# Patient Record
Sex: Female | Born: 1990 | Race: White | Hispanic: No | Marital: Single | Smoking: Never smoker
Health system: Southern US, Community
[De-identification: ages and names within clinical notes are randomized; demographics above are authoritative.]

## PROBLEM LIST (undated history)

## (undated) DIAGNOSIS — F419 Anxiety disorder, unspecified: Secondary | ICD-10-CM

## (undated) DIAGNOSIS — Z8619 Personal history of other infectious and parasitic diseases: Secondary | ICD-10-CM

## (undated) HISTORY — DX: Personal history of other infectious and parasitic diseases: Z86.19

## (undated) HISTORY — DX: Anxiety disorder, unspecified: F41.9

---

## 2014-11-15 LAB — HM HIV SCREENING LAB: HM HIV Screening: NEGATIVE

## 2014-11-15 LAB — HM PAP SMEAR

## 2015-11-19 HISTORY — PX: APPENDECTOMY: SHX54

## 2015-12-09 LAB — CBC AND DIFFERENTIAL
HEMATOCRIT: 39 (ref 36–46)
HEMOGLOBIN: 13.2 (ref 12.0–16.0)
Neutrophils Absolute: 5
Platelets: 302 (ref 150–399)
WBC: 7.8

## 2015-12-09 LAB — HEPATIC FUNCTION PANEL
ALK PHOS: 89 (ref 25–125)
ALT: 33 (ref 7–35)
AST: 28 (ref 13–35)
Bilirubin, Total: 0.7

## 2015-12-09 LAB — BASIC METABOLIC PANEL
BUN: 14 (ref 4–21)
Creatinine: 1 (ref 0.5–1.1)
Glucose: 89
POTASSIUM: 4.2 (ref 3.4–5.3)
SODIUM: 140 (ref 137–147)

## 2015-12-15 LAB — HM PAP SMEAR: HM PAP: REACTIVE

## 2017-01-18 DIAGNOSIS — Z8619 Personal history of other infectious and parasitic diseases: Secondary | ICD-10-CM

## 2017-01-18 HISTORY — DX: Personal history of other infectious and parasitic diseases: Z86.19

## 2017-03-28 LAB — HM PAP SMEAR

## 2017-07-31 ENCOUNTER — Encounter (HOSPITAL_COMMUNITY): Payer: Self-pay

## 2017-07-31 ENCOUNTER — Other Ambulatory Visit: Payer: Self-pay

## 2017-07-31 ENCOUNTER — Emergency Department (HOSPITAL_COMMUNITY): Payer: PRIVATE HEALTH INSURANCE

## 2017-07-31 ENCOUNTER — Emergency Department (HOSPITAL_COMMUNITY)
Admission: EM | Admit: 2017-07-31 | Discharge: 2017-07-31 | Disposition: A | Discharge status: Home / Self Care | Payer: PRIVATE HEALTH INSURANCE | Attending: Emergency Medicine | Admitting: Emergency Medicine

## 2017-07-31 DIAGNOSIS — Y9302 Activity, running: Secondary | ICD-10-CM | POA: Insufficient documentation

## 2017-07-31 DIAGNOSIS — S99911A Unspecified injury of right ankle, initial encounter: Secondary | ICD-10-CM | POA: Diagnosis present

## 2017-07-31 DIAGNOSIS — Y92830 Public park as the place of occurrence of the external cause: Secondary | ICD-10-CM | POA: Insufficient documentation

## 2017-07-31 DIAGNOSIS — S93401A Sprain of unspecified ligament of right ankle, initial encounter: Secondary | ICD-10-CM | POA: Diagnosis not present

## 2017-07-31 DIAGNOSIS — S9304XA Dislocation of right ankle joint, initial encounter: Secondary | ICD-10-CM | POA: Diagnosis not present

## 2017-07-31 DIAGNOSIS — Y999 Unspecified external cause status: Secondary | ICD-10-CM | POA: Diagnosis not present

## 2017-07-31 DIAGNOSIS — W010XXA Fall on same level from slipping, tripping and stumbling without subsequent striking against object, initial encounter: Secondary | ICD-10-CM | POA: Diagnosis not present

## 2017-07-31 DIAGNOSIS — R112 Nausea with vomiting, unspecified: Secondary | ICD-10-CM

## 2017-07-31 MED ORDER — ONDANSETRON 4 MG PO TBDP: 4.0000 mg | ORAL_TABLET | Freq: Three times a day (TID) | ORAL | 0 refills | Status: DC | PRN

## 2017-07-31 MED ORDER — ONDANSETRON 8 MG PO TBDP
8.0000 mg | ORAL_TABLET | Freq: Once | ORAL | Status: AC
  Administered 2017-07-31: 8 mg via ORAL
  Filled 2017-07-31: qty 1

## 2017-07-31 MED ORDER — NAPROXEN 500 MG PO TABS: 500.0000 mg | ORAL_TABLET | Freq: Two times a day (BID) | ORAL | 0 refills | Status: DC | PRN

## 2017-07-31 MED ORDER — HYDROCODONE-ACETAMINOPHEN 5-325 MG PO TABS: 1.0000 | ORAL_TABLET | Freq: Four times a day (QID) | ORAL | 0 refills | Status: DC | PRN

## 2017-07-31 MED ORDER — HYDROCODONE-ACETAMINOPHEN 5-325 MG PO TABS
1.0000 | ORAL_TABLET | Freq: Once | ORAL | Status: AC
  Administered 2017-07-31: 1 via ORAL
  Filled 2017-07-31: qty 1

## 2017-07-31 MED ORDER — MORPHINE SULFATE (PF) 4 MG/ML IV SOLN
4.0000 mg | Freq: Once | INTRAVENOUS | Status: DC
  Filled 2017-07-31: qty 1

## 2017-07-31 MED ORDER — MORPHINE SULFATE (PF) 4 MG/ML IV SOLN
4.0000 mg | Freq: Once | INTRAVENOUS | Status: AC
  Administered 2017-07-31: 4 mg via INTRAMUSCULAR

## 2017-07-31 MED ORDER — ONDANSETRON HCL 4 MG/2ML IJ SOLN
4.0000 mg | Freq: Once | INTRAMUSCULAR | Status: DC
  Filled 2017-07-31: qty 2

## 2017-07-31 NOTE — ED Provider Notes (Signed)
[facility]-EMERGENCY DEPT Provider Note   CSN: [id number] Arrival date & time: 07/31/17  1446     History   Chief Complaint Chief Complaint  Patient presents with  . Ankle Injury    HPI Helen [name] is a 26 y.o. female with a PMHx of anxiety, who presents to the ED via EMS with complaints of right ankle injury.  Patient states that approximately 2 hours ago she was running on [address] when she accidentally slipped on a wet root, inverting her ankle, and heard a loud pop and crack which instantly made her go to the ground.  When she looked at her ankle, she noticed that it was "floppy", and it's believed it was potentially dislocated.  She attempted to reposition it, which made her vomit due to the intense pain, but she thinks that she was able to get it aligned again.  She ambulated to safety, walking about 20 minutes to find help, and when she found help she was unable to ambulate any further because the pain was extremely intense.  EMS was called, and [place] team who brought her in here.  She endorses 9/10 constant sharp and throbbing right ankle pain that radiates up to the mid calf, worse with movement and walking, and moderately improved with fentanyl 100 mcg intranasal as well as the splint that they put her in.  She reports associated tingling in her toes, and swelling to the ankle.  She also reports mild nausea, and the one episode of nonbloody nonbilious emesis when she repositioned her ankle initially.  She has not eaten anything since 10:15 AM when she had a piece of toast and a banana.  She just recently moved here from [place] several months ago, she does not have a PCP here, and does not have any orthopedic care here.  She denies any wounds, head injury or LOC, neck or back pain, other injury sustained during the incident, bruising, CP, S OB, abdominal pain, hematemesis, numbness, focal  weakness, or any other complaints at this time.   The history is provided by the patient and medical records. No language interpreter was used.  Ankle Injury  This is a new problem. The current episode started 1 to 2 hours ago. The problem occurs constantly. The problem has not changed since onset.Pertinent negatives include no chest pain, no abdominal pain and no shortness of breath. The symptoms are aggravated by walking and standing. Relieved by: fentanyl 100mcg. Treatments tried: fentanyl 100mcg. The treatment provided moderate relief.    History reviewed. No pertinent past medical history.  There are no active problems to display for this patient.   Past Surgical History:  Procedure Laterality Date  . APPENDECTOMY  11/2015    OB History    No data available       Home Medications    Prior to Admission medications   Not on File    Family History No family history on file.  Social History Social History   Tobacco Use  . Smoking status: Never Smoker  . Smokeless tobacco: Never Used  Substance Use Topics  . Alcohol use: No    Frequency: Never  . Drug use: No     Allergies   Patient has no known allergies.   Review of Systems Review of Systems  HENT: Negative for facial swelling (no head inj).   Respiratory: Negative for shortness of  breath.   Cardiovascular: Negative for chest pain.  Gastrointestinal: Positive for nausea and vomiting. Negative for abdominal pain.  Musculoskeletal: Positive for arthralgias and joint swelling. Negative for back pain and neck pain.  Skin: Negative for color change and wound.  Allergic/Immunologic: Negative for immunocompromised state.  Neurological: Negative for syncope, weakness and numbness.  Psychiatric/Behavioral: Negative for confusion.  All other systems reviewed and are negative for acute change except as noted in the HPI.     Physical Exam Updated Vital Signs BP 109/66   Pulse 62   Temp 98 F (36.7 C) (Oral)    Resp 19   SpO2 100%   Physical Exam  Constitutional: She is oriented to person, place, and time. Vital signs are normal. She appears well-developed and well-nourished.  Non-toxic appearance. No distress.  Afebrile, nontoxic, tearful at times, but overall in NAD  HENT:  Head: Normocephalic and atraumatic.  Mouth/Throat: Oropharynx is clear and moist and mucous membranes are normal.  Eyes: Conjunctivae and EOM are normal. Right eye exhibits no discharge. Left eye exhibits no discharge.  Neck: Normal range of motion. Neck supple.  Cardiovascular: Normal rate, regular rhythm, normal heart sounds and intact distal pulses. Exam reveals no gallop and no friction rub.  No murmur heard. Pulmonary/Chest: Effort normal and breath sounds normal. No respiratory distress. She has no decreased breath sounds. She has no wheezes. She has no rhonchi. She has no rales.  Abdominal: Soft. Normal appearance and bowel sounds are normal. She exhibits no distension. There is no tenderness. There is no rigidity, no rebound, no guarding, no CVA tenderness, no tenderness at McBurney's point and negative Murphy's sign.  Musculoskeletal:       Right ankle: She exhibits decreased range of motion, swelling and ecchymosis. She exhibits no deformity, no laceration and normal pulse. Tenderness. Lateral malleolus and medial malleolus tenderness found. Achilles tendon normal.  All spinal levels nonTTP without bony stepoffs or deformities No pelvic instability or tenderness RLE splinted by EMS; no tenderness to thigh or knee, mild tenderness to mid-calf area. R ankle with diminished ROM due to pain, significant swelling particularly to lateral malleolus, ?crepitus but no definite deformity, with significant TTP of the lateral malleolus and some milder TTP to medial malleolus, but no TTP or swelling of fore foot. No break in skin. +bruising. Achilles appears intact however thompson's test difficult to complete due to splint. Pedal  pulse difficult to palpate however doppler easily finds it with good waveform; adequate cap refill. Wiggling toes without difficulty. Sensation grossly intact. Soft compartments   Neurological: She is alert and oriented to person, place, and time. She has normal strength. No sensory deficit.  Skin: Skin is warm, dry and intact. No rash noted.  Psychiatric: She has a normal mood and affect.  Nursing note and vitals reviewed.    ED Treatments / Results  Labs (all labs ordered are listed, but only abnormal results are displayed) Labs Reviewed - No data to display  EKG  EKG Interpretation None       Radiology Dg Tibia/fibula Right  Result Date: 07/31/2017 CLINICAL DATA:  Injured while running today. EXAM: RIGHT TIBIA AND FIBULA - 2 VIEW COMPARISON:  None. FINDINGS: The knee and ankle joints are maintained. No acute fracture of the tibia or fibula is identified. IMPRESSION: No acute fracture. Electronically Signed   By: Rudie Meyer M.D.   On: 07/31/2017 15:57   Dg Ankle Complete Right  Result Date: 07/31/2017 CLINICAL DATA:  Injured right ankle while  running today. EXAM: RIGHT ANKLE - COMPLETE 3+ VIEW COMPARISON:  None. FINDINGS: The ankle mortise is maintained. No acute ankle fracture. No osteochondral lesion. No ankle joint effusion. The mid and hindfoot bony structures are intact. Small os trigonum noted. IMPRESSION: No acute ankle fracture. Electronically Signed   By: Rudie Meyer M.D.   On: 07/31/2017 15:58   Dg Foot Complete Right  Result Date: 07/31/2017 CLINICAL DATA:  Injured right foot while running today. EXAM: RIGHT FOOT COMPLETE - 3+ VIEW COMPARISON:  None. FINDINGS: The joint spaces are maintained. No acute foot fracture is identified. IMPRESSION: No acute fracture. Electronically Signed   By: Rudie Meyer M.D.   On: 07/31/2017 15:59    Procedures Procedures (including critical care time)  SPLINT APPLICATION Date/Time: 5:26 PM Authorized by: Rhona Raider Consent: Verbal consent obtained. Risks and benefits: risks, benefits and alternatives were discussed Consent given by: patient Splint applied by: orthopedic technician Location details: R leg Splint type: posterior short leg Supplies used: orthoglass Post-procedure: The splinted body part was neurovascularly unchanged following the procedure. Patient tolerance: Patient tolerated the procedure well with no immediate complications.     Medications Ordered in ED Medications  ondansetron (ZOFRAN-ODT) disintegrating tablet 8 mg (8 mg Oral Given 07/31/17 1645)  morphine 4 MG/ML injection 4 mg (4 mg Intramuscular Given 07/31/17 1645)     Initial Impression / Assessment and Plan / ED Course  I have reviewed the triage vital signs and the nursing notes.  Pertinent labs & imaging results that were available during my care of the patient were reviewed by me and considered in my medical decision making (see chart for details).     26 y.o. female here with R ankle injury, possible dislocation that she relocated herself. On exam, no midline spinal tenderness, no pelvic instability, no thigh or knee tenderness, slight tenderness to mid-calf area and significant tenderness to lateral malleolus, significant swelling and bruising to lateral malleolus, does not appear dislocated, ?crepitus, no tenderness to foot, distal pulse hard to palpate but doppler with adequate wave form, sensation grossly intact, soft compartments. Will obtain xray of tib/fib, ankle, and foot just in case she has an injury there that she's just distracted from. Had n/v likely from adrenaline rush, no abdominal tenderness; will give zofran and morphine, and await imaging. Will reassess shortly.   5:26 PM Xrays negative. Given that she described a possible ankle dislocation, I suspect she could have significant ligamentous injury, therefore will treat conservatively and splint ankle in case she has occult injury; will give  crutches for all weight bearing activities. Advised RICE, will rx pain meds and zofran, and have her f/up with ortho in 5-7 days for recheck. Pt feeling better and tolerating PO well; doubt need for further emergent work up at this time. I explained the diagnosis and have given explicit precautions to return to the ER including for any other new or worsening symptoms. The patient understands and accepts the medical plan as it's been dictated and I have answered their questions. Discharge instructions concerning home care and prescriptions have been given. The patient is STABLE and is discharged to home in good condition.   [place] reviewed prior to dispensing controlled substance medications, and 2 year search was notable for: none found. Risks/benefits/alternatives and expectations discussed regarding controlled substances. Side effects of medications discussed. Informed consent obtained.    Final Clinical Impressions(s) / ED Diagnoses   Final diagnoses:  Sprain of right ankle, unspecified ligament, initial encounter  Closed dislocation of right ankle, initial encounter  Nausea and vomiting in adult patient    ED Discharge Orders        Ordered    naproxen (NAPROSYN) 500 MG tablet  2 times daily PRN     07/31/17 1720    HYDROcodone-acetaminophen (NORCO) 5-325 MG tablet  Every 6 hours PRN     07/31/17 1720    ondansetron (ZOFRAN ODT) 4 MG disintegrating tablet  Every 8 hours PRN     07/31/17 [address]       Megyn Leng, MullikenMercedes, [place] JerseyPA-C 07/31/17 1726    Arby BarrettePfeiffer, Marcy, MD 08/06/17 [phone]

## 2017-07-31 NOTE — ED Triage Notes (Signed)
Pt was running / hiking on trail. Pt slipped on wet root and injured ankle. Pt stated that ankle was very "floppy". EMS arrived, stabilized right ankle and femur. Pt current pain is 8-10

## 2017-07-31 NOTE — ED Notes (Signed)
Bed: [id number] Expected date:  Expected time:  Means of arrival:  Comments: EMS- ankle dislocation

## 2017-07-31 NOTE — Discharge Instructions (Signed)
Wear ankle splint until you see the orthopedist. Use crutches for all weight bearing activities until you see the orthopedist. Ice and elevate ankle throughout the day, using ice pack for no more than 20 minutes every hour.  Alternate between naprosyn and norco for pain relief. Do not drive or operate machinery with pain medication use. Use zofran as directed as needed for nausea; stay well hydrated, get plenty of rest. Call orthopedic follow up today or tomorrow to schedule followup appointment for recheck in 5-7 days. Return to the ER for changes or worsening symptoms.

## 2017-08-01 ENCOUNTER — Ambulatory Visit (INDEPENDENT_AMBULATORY_CARE_PROVIDER_SITE_OTHER): Payer: No Typology Code available for payment source | Admitting: Orthopaedic Surgery

## 2017-08-01 ENCOUNTER — Encounter (INDEPENDENT_AMBULATORY_CARE_PROVIDER_SITE_OTHER): Payer: Self-pay | Admitting: Orthopaedic Surgery

## 2017-08-01 DIAGNOSIS — M25571 Pain in right ankle and joints of right foot: Secondary | ICD-10-CM | POA: Diagnosis not present

## 2017-08-01 NOTE — Progress Notes (Signed)
Office Visit Note   Patient: Helen [name]           Date of Birth: October 26, 1990           MRN: [id number] Visit Date: 08/01/2017              Requested by: No referring provider defined for this encounter. PCP: Patient, No Pcp Per   Assessment & Plan: Visit Diagnoses:  1. Pain in right ankle and joints of right foot     Plan: I am not totally convinced that she had a true subtalar dislocation although it sounds like she had a severe injury to her ankle that would benefit from cast immobilization especially with her anxiety regarding the matter.  I think she may have had a subluxation of her subtalar joint.  We will make her nonweightbearing for 2 weeks in a short leg cast.  I told her that she needs to take aspirin for DVT prophylaxis.  Follow-up in 2 weeks for recheck out of the cast.  If not better may need to consider  Follow-Up Instructions: Return in about 2 weeks (around 08/15/2017).   Orders:  No orders of the defined types were placed in this encounter.  No orders of the defined types were placed in this encounter.     Procedures: No procedures performed   Clinical Data: No additional findings.   Subjective: Chief Complaint  Patient presents with  . Right Ankle - Pain    Patient is a 26 year old female who comes in with a self-reported right ankle dislocation from a severe inversion injury on 07/31/2017.  She states that she missed stepped on a tree root and felt a pop in her ankle was floppy and she felt that she had to pop it back in place but that she has severe pain which caused her to vomit.  She was evaluated in the ED and x-rays were normal for acute findings.  She denies any numbness and tingling.  She has significant pain with weightbearing.    Review of Systems  Constitutional: Negative.   HENT: Negative.   Eyes: Negative.   Respiratory: Negative.   Cardiovascular: Negative.   Endocrine: Negative.   Musculoskeletal: Negative.     Neurological: Negative.   Hematological: Negative.   Psychiatric/Behavioral: Negative.   All other systems reviewed and are negative.    Objective: Vital Signs: LMP 07/17/2017   Physical Exam  Constitutional: She is oriented to person, place, and time. She appears well-developed and well-nourished.  HENT:  Head: Normocephalic and atraumatic.  Eyes: EOM are normal.  Neck: Neck supple.  Pulmonary/Chest: Effort normal.  Abdominal: Soft.  Neurological: She is alert and oriented to person, place, and time.  Skin: Skin is warm. Capillary refill takes less than 2 seconds.  Psychiatric: She has a normal mood and affect. Her behavior is normal. Judgment and thought content normal.  Nursing note and vitals reviewed.   Ortho Exam Right ankle exam shows bruising and moderate swelling on the lateral aspect of the heel and ankle.  She is tender over the lateral ankle ligaments.  Ankle joint and subtalar joints are grossly stable. Specialty Comments:  No specialty comments available.  Imaging: No results found.   PMFS History: Patient Active Problem List   Diagnosis Date Noted  . Pain in right ankle and joints of right foot 08/01/2017   History reviewed. No pertinent past medical history.  History reviewed. No pertinent family history.  Past Surgical History:  Procedure Laterality Date  .  APPENDECTOMY  11/2015   Social History   Occupational History  . Not on file  Tobacco Use  . Smoking status: Never Smoker  . Smokeless tobacco: Never Used  Substance and Sexual Activity  . Alcohol use: No    Frequency: Never  . Drug use: No  . Sexual activity: Yes    Birth control/protection: IUD, Condom

## 2017-08-15 ENCOUNTER — Ambulatory Visit (INDEPENDENT_AMBULATORY_CARE_PROVIDER_SITE_OTHER): Payer: No Typology Code available for payment source | Admitting: Orthopaedic Surgery

## 2017-08-15 ENCOUNTER — Encounter (INDEPENDENT_AMBULATORY_CARE_PROVIDER_SITE_OTHER): Payer: Self-pay | Admitting: Orthopaedic Surgery

## 2017-08-15 ENCOUNTER — Ambulatory Visit (INDEPENDENT_AMBULATORY_CARE_PROVIDER_SITE_OTHER): Payer: No Typology Code available for payment source

## 2017-08-15 DIAGNOSIS — M25571 Pain in right ankle and joints of right foot: Secondary | ICD-10-CM

## 2017-08-15 NOTE — Progress Notes (Signed)
   Office Visit Note   Patient: Helen [name]           Date of Birth: March 28, 1991           MRN: [id number] Visit Date: 08/15/2017              Requested by: No referring provider defined for this encounter. PCP: Patient, No Pcp Per   Assessment & Plan: Visit Diagnoses:  1. Pain in right ankle and joints of right foot     Plan: At this point we will discontinue the short leg cast.  Begin weightbearing as tolerated with crutches.  Referral to physical therapy for strengthening and gait training.   [place] going back [place] to [place] and will follow up with us after she gets back.  Follow-Up Instructions: Return in about 4 weeks (around 09/12/2017).   Orders:  Orders Placed This Encounter  Procedures  . XR Ankle Complete Right   No orders of the defined types were placed in this encounter.     Procedures: No procedures performed   Clinical Data: No additional findings.   Subjective: Chief Complaint  Patient presents with  . Right Ankle - Follow-up    [name] is 2 weeks status post a suspected subtalar subluxation ankle sprain.  She is doing better from the 2 weeks of casting.  Denies any numbness and tingling.  Just some mild pain.    Review of Systems   Objective: Vital Signs: LMP 07/17/2017   Physical Exam  Ortho Exam Right ankle exam shows some persistent bruising without any significant swelling.  Neurovascular intact. Specialty Comments:  No specialty comments available.  Imaging: No results found.   PMFS History: Patient Active Problem List   Diagnosis Date Noted  . Pain in right ankle and joints of right foot 08/01/2017   History reviewed. No pertinent past medical history.  History reviewed. No pertinent family history.  Past Surgical History:  Procedure Laterality Date  . APPENDECTOMY  11/2015   Social History   Occupational History  . Not on file  Tobacco Use  . Smoking status: Never Smoker  . Smokeless tobacco: Never Used    Substance and Sexual Activity  . Alcohol use: No    Frequency: Never  . Drug use: No  . Sexual activity: Yes    Birth control/protection: IUD, Condom

## 2017-08-17 ENCOUNTER — Telehealth (INDEPENDENT_AMBULATORY_CARE_PROVIDER_SITE_OTHER): Payer: Self-pay | Admitting: *Deleted

## 2017-08-17 NOTE — Telephone Encounter (Signed)
Received fax from PT and [facility] [address] advising pt has appt scheduled for 08/17/17 at 11:00am, per fax pt is aware fo appt

## 2017-08-31 ENCOUNTER — Telehealth (INDEPENDENT_AMBULATORY_CARE_PROVIDER_SITE_OTHER): Payer: Self-pay | Admitting: Orthopaedic Surgery

## 2017-08-31 NOTE — Telephone Encounter (Signed)
Pt came into to the office and she needs verbal orders to continue her Physical therapy.Pt will be leaving for 3 weeks and will need her 6 appts to follow her while in [place].

## 2017-09-01 NOTE — Telephone Encounter (Signed)
yes

## 2017-09-01 NOTE — Telephone Encounter (Signed)
Called no answer LMOM Dr Roda ShuttersXu approved orders

## 2017-09-01 NOTE — Telephone Encounter (Signed)
Is this okay?

## 2017-09-26 ENCOUNTER — Encounter (INDEPENDENT_AMBULATORY_CARE_PROVIDER_SITE_OTHER): Payer: Self-pay | Admitting: Orthopaedic Surgery

## 2017-09-26 ENCOUNTER — Ambulatory Visit (INDEPENDENT_AMBULATORY_CARE_PROVIDER_SITE_OTHER): Payer: No Typology Code available for payment source | Admitting: Orthopaedic Surgery

## 2017-09-26 DIAGNOSIS — M25571 Pain in right ankle and joints of right foot: Secondary | ICD-10-CM

## 2017-09-26 NOTE — Progress Notes (Signed)
   Office Visit Note   Patient: Helen [name]           Date of Birth: July 20, 1991           MRN: [id number] Visit Date: 09/26/2017              Requested by: No referring provider defined for this encounter. PCP: Patient, No Pcp Per   Assessment & Plan: Visit Diagnoses:  1. Pain in right ankle and joints of right foot     Plan: Impression is 27 year old female with suspected subtalar dislocation who is approximately 8 weeks out now.  At this point she can discontinue physical therapy and increase her activity as tolerated.  She is in agreement with the plan.  Follow-up as needed.  Follow-Up Instructions: Return if symptoms worsen or fail to improve.   Orders:  No orders of the defined types were placed in this encounter.  No orders of the defined types were placed in this encounter.     Procedures: No procedures performed   Clinical Data: No additional findings.   Subjective: Chief Complaint  Patient presents with  . Right Ankle - Pain    [name] follows up today for her right ankle injury.  She is doing much better.  She has completed physical therapy.  She is ambulating with a normal shoe and some mild discomfort in the Achilles overall she is back in the gym and doing squats without any problems.    Review of Systems   Objective: Vital Signs: There were no vitals taken for this visit.  Physical Exam  Ortho Exam Right foot and ankle exam are relatively benign.  There is no evidence of swelling or ecchymosis.  There is no instability. Specialty Comments:  No specialty comments available.  Imaging: No results found.   PMFS History: Patient Active Problem List   Diagnosis Date Noted  . Pain in right ankle and joints of right foot 08/01/2017   History reviewed. No pertinent past medical history.  History reviewed. No pertinent family history.  Past Surgical History:  Procedure Laterality Date  . APPENDECTOMY  11/2015   Social History    Occupational History  . Not on file  Tobacco Use  . Smoking status: Never Smoker  . Smokeless tobacco: Never Used  Substance and Sexual Activity  . Alcohol use: No    Frequency: Never  . Drug use: No  . Sexual activity: Yes    Birth control/protection: IUD, Condom

## 2018-02-14 ENCOUNTER — Ambulatory Visit: Payer: Self-pay | Admitting: Family Medicine

## 2018-02-14 VITALS — BP 118/72 | HR 65 | Temp 98.7°F | Resp 20 | Wt 184.8 lb

## 2018-02-14 DIAGNOSIS — N3001 Acute cystitis with hematuria: Secondary | ICD-10-CM

## 2018-02-14 DIAGNOSIS — R300 Dysuria: Secondary | ICD-10-CM

## 2018-02-14 LAB — POCT URINALYSIS DIP (MANUAL ENTRY)
Bilirubin, UA: NEGATIVE
Glucose, UA: NEGATIVE mg/dL
Ketones, POC UA: NEGATIVE mg/dL
Leukocytes, UA: NEGATIVE
NITRITE UA: NEGATIVE
PH UA: 6 (ref 5.0–8.0)
PROTEIN UA: NEGATIVE mg/dL
SPEC GRAV UA: 1.01 (ref 1.010–1.025)
Urobilinogen, UA: 0.2 E.U./dL

## 2018-02-14 MED ORDER — NITROFURANTOIN MONOHYD MACRO 100 MG PO CAPS: 100.0000 mg | ORAL_CAPSULE | Freq: Two times a day (BID) | ORAL | 0 refills | Status: AC

## 2018-02-14 NOTE — Patient Instructions (Signed)
Urinary Tract Infection, Adult A urinary tract infection (UTI) is an infection of any part of the urinary tract. The urinary tract includes the:  Kidneys.  Ureters.  Bladder.  Urethra.  These organs make, store, and get rid of pee (urine) in the body. Follow these instructions at home:  Take over-the-counter and prescription medicines only as told by your doctor.  If you were prescribed an antibiotic medicine, take it as told by your doctor. Do not stop taking the antibiotic even if you start to feel better.  Avoid the following drinks: ? Alcohol. ? Caffeine. ? Tea. ? Carbonated drinks.  Drink enough fluid to keep your pee clear or pale yellow.  Keep all follow-up visits as told by your doctor. This is important.  Make sure to: ? Empty your bladder often and completely. Do not to hold pee for long periods of time. ? Empty your bladder before and after sex. ? Wipe from front to back after a bowel movement if you are female. Use each tissue one time when you wipe. Contact a doctor if:  You have back pain.  You have a fever.  You feel sick to your stomach (nauseous).  You throw up (vomit).  Your symptoms do not get better after 3 days.  Your symptoms go away and then come back. Get help right away if:  You have very bad back pain.  You have very bad lower belly (abdominal) pain.  You are throwing up and cannot keep down any medicines or water. This information is not intended to replace advice given to you by your health care provider. Make sure you discuss any questions you have with your health care provider. Document Released: 02/23/2008 Document Revised: 02/12/2016 Document Reviewed: 07/28/2015 Elsevier Interactive Patient Education  2018 [employer]. Pyelonephritis, Adult Pyelonephritis is a kidney infection. The kidneys are organs that help clean your blood by moving waste out of your blood and into your pee (urine). This infection can happen quickly, or  it can last for a long time. In most cases, it clears up with treatment and does not cause other problems. Follow these instructions at home: Medicines  Take over-the-counter and prescription medicines only as told by your doctor.  Take your antibiotic medicine as told by your doctor. Do not stop taking the medicine even if you start to feel better. General instructions  Drink enough fluid to keep your pee clear or pale yellow.  Avoid caffeine, tea, and carbonated drinks.  Pee (urinate) often. Avoid holding in pee for long periods of time.  Pee before and after sex.  After pooping (having a bowel movement), women should wipe from front to back. Use each tissue only once.  Keep all follow-up visits as told by your doctor. This is important. Contact a doctor if:  You do not feel better after 2 days.  Your symptoms get worse.  You have a fever. Get help right away if:  You cannot take your medicine or drink fluids as told.  You have chills and shaking.  You throw up (vomit).  You have very bad pain in your side (flank) or back.  You feel very weak or you pass out (faint). This information is not intended to replace advice given to you by your health care provider. Make sure you discuss any questions you have with your health care provider. Document Released: 10/14/2004 Document Revised: 02/12/2016 Document Reviewed: 12/30/2014 Elsevier Interactive Patient Education  [employer].

## 2018-02-14 NOTE — Progress Notes (Signed)
Helen [name] is a 27 y.o. female who presents today with concerns of UTI. Initial symptoms began 5/5 and treatment initiated on 5/9- Septra DS BID x 10 days with some relief. Symptoms returned in last 48 hours- menstrual cycle also began today- patient reports pain and burning on urination.  Review of Systems  Constitutional: Negative for chills, fever and malaise/fatigue.  HENT: Negative for congestion, ear discharge, ear pain, sinus pain and sore throat.   Eyes: Negative.   Respiratory: Negative for cough, sputum production and shortness of breath.   Cardiovascular: Negative.  Negative for chest pain.  Gastrointestinal: Negative for abdominal pain, diarrhea, nausea and vomiting.  Genitourinary: Positive for dysuria, flank pain, frequency, hematuria and urgency.  Musculoskeletal: Negative for myalgias.  Skin: Negative.   Neurological: Negative for headaches.  Endo/Heme/Allergies: Negative.   Psychiatric/Behavioral: Negative.     O: Vitals:   02/14/18 1710  BP: 118/72  Pulse: 65  Resp: 20  Temp: 98.7 F (37.1 C)  SpO2: 97%     Physical Exam  Constitutional: She is oriented to person, place, and time. Vital signs are normal. She appears well-developed and well-nourished. She is active.  Non-toxic appearance. She does not have a sickly appearance.  HENT:  Head: Normocephalic.  Right Ear: Hearing, tympanic membrane, external ear and ear canal normal.  Left Ear: Hearing, tympanic membrane, external ear and ear canal normal.  Nose: Nose normal.  Mouth/Throat: Uvula is midline and oropharynx is clear and moist.  Neck: Normal range of motion. Neck supple.  Cardiovascular: Normal rate, regular rhythm, normal heart sounds and normal pulses.  Pulmonary/Chest: Effort normal and breath sounds normal.  Abdominal: Soft. Bowel sounds are normal. There is CVA tenderness. There is no rigidity, no rebound and no guarding.  Musculoskeletal: Normal range of motion.  Lymphadenopathy:   Head (right side): No submental and no submandibular adenopathy present.       Head (left side): No submental and no submandibular adenopathy present.    She has no cervical adenopathy.  Neurological: She is alert and oriented to person, place, and time.  Psychiatric: She has a normal mood and affect.  Vitals reviewed.    A: 1. Dysuria   2. Acute cystitis with hematuria     P: Will treat with Macrobid for entire 10 day course- recommend if symptoms persist for patient to seek care at higher level of care or PCP.  Exam findings, diagnosis etiology and medication use and indications reviewed with patient. Follow- Up and discharge instructions provided. No emergent/urgent issues found on exam.  Patient verbalized understanding of information provided and agrees with plan of care (POC), all questions answered.  1. Dysuria - POCT urinalysis dipstick Results for orders placed or performed in visit on 02/14/18 (from the past 24 hour(s))  POCT urinalysis dipstick     Status: Abnormal   Collection Time: 02/14/18  5:09 PM  Result Value Ref Range   Color, UA light yellow (A) yellow   Clarity, UA clear clear   Glucose, UA negative negative mg/dL   Bilirubin, UA negative negative   Ketones, POC UA negative negative mg/dL   Spec Grav, UA 2.725 3.664 - 1.025   Blood, UA large (A) negative   pH, UA 6.0 5.0 - 8.0   Protein Ur, POC negative negative mg/dL   Urobilinogen, UA 0.2 0.2 or 1.0 E.U./dL   Nitrite, UA Negative Negative   Leukocytes, UA Negative Negative     2. Acute cystitis with hematuria  Other orders -  citalopram (CELEXA) 20 MG tablet; Take 20 mg by mouth daily.

## 2018-02-21 ENCOUNTER — Encounter: Payer: Self-pay | Admitting: Physician Assistant

## 2018-02-21 ENCOUNTER — Ambulatory Visit (INDEPENDENT_AMBULATORY_CARE_PROVIDER_SITE_OTHER): Payer: PRIVATE HEALTH INSURANCE | Admitting: Physician Assistant

## 2018-02-21 ENCOUNTER — Ambulatory Visit (INDEPENDENT_AMBULATORY_CARE_PROVIDER_SITE_OTHER)
Admission: RE | Admit: 2018-02-21 | Discharge: 2018-02-21 | Disposition: A | Discharge status: Home / Self Care | Payer: PRIVATE HEALTH INSURANCE | Source: Ambulatory Visit | Attending: Physician Assistant | Admitting: Physician Assistant

## 2018-02-21 ENCOUNTER — Other Ambulatory Visit (HOSPITAL_COMMUNITY)
Admission: RE | Admit: 2018-02-21 | Discharge: 2018-02-21 | Disposition: A | Discharge status: Home / Self Care | Payer: No Typology Code available for payment source | Source: Ambulatory Visit | Attending: Physician Assistant | Admitting: Physician Assistant

## 2018-02-21 VITALS — BP 116/64 | HR 58 | Temp 97.9°F | Ht 65.25 in | Wt 182.4 lb

## 2018-02-21 DIAGNOSIS — R5383 Other fatigue: Secondary | ICD-10-CM

## 2018-02-21 DIAGNOSIS — N898 Other specified noninflammatory disorders of vagina: Secondary | ICD-10-CM | POA: Insufficient documentation

## 2018-02-21 DIAGNOSIS — Z7689 Persons encountering health services in other specified circumstances: Secondary | ICD-10-CM

## 2018-02-21 DIAGNOSIS — R110 Nausea: Secondary | ICD-10-CM

## 2018-02-21 DIAGNOSIS — Z114 Encounter for screening for human immunodeficiency virus [HIV]: Secondary | ICD-10-CM | POA: Diagnosis not present

## 2018-02-21 DIAGNOSIS — R109 Unspecified abdominal pain: Secondary | ICD-10-CM | POA: Diagnosis not present

## 2018-02-21 DIAGNOSIS — R300 Dysuria: Secondary | ICD-10-CM | POA: Diagnosis not present

## 2018-02-21 DIAGNOSIS — F4310 Post-traumatic stress disorder, unspecified: Secondary | ICD-10-CM | POA: Insufficient documentation

## 2018-02-21 DIAGNOSIS — F419 Anxiety disorder, unspecified: Secondary | ICD-10-CM | POA: Insufficient documentation

## 2018-02-21 LAB — CBC WITH DIFFERENTIAL/PLATELET
BASOS ABS: 0.1 10*3/uL (ref 0.0–0.1)
Basophils Relative: 1.1 % (ref 0.0–3.0)
Eosinophils Absolute: 0.2 10*3/uL (ref 0.0–0.7)
Eosinophils Relative: 3.1 % (ref 0.0–5.0)
HCT: 43.5 % (ref 36.0–46.0)
Hemoglobin: 14.8 g/dL (ref 12.0–15.0)
LYMPHS ABS: 2.1 10*3/uL (ref 0.7–4.0)
Lymphocytes Relative: 31.7 % (ref 12.0–46.0)
MCHC: 34 g/dL (ref 30.0–36.0)
MCV: 93.5 fl (ref 78.0–100.0)
MONO ABS: 0.4 10*3/uL (ref 0.1–1.0)
Monocytes Relative: 6 % (ref 3.0–12.0)
NEUTROS PCT: 58.1 % (ref 43.0–77.0)
Neutro Abs: 3.9 10*3/uL (ref 1.4–7.7)
PLATELETS: 290 10*3/uL (ref 150.0–400.0)
RBC: 4.65 Mil/uL (ref 3.87–5.11)
RDW: 13 % (ref 11.5–15.5)
WBC: 6.8 10*3/uL (ref 4.0–10.5)

## 2018-02-21 LAB — COMPREHENSIVE METABOLIC PANEL
ALK PHOS: 80 U/L (ref 39–117)
ALT: 21 U/L (ref 0–35)
AST: 20 U/L (ref 0–37)
Albumin: 4.8 g/dL (ref 3.5–5.2)
BUN: 20 mg/dL (ref 6–23)
CO2: 27 meq/L (ref 19–32)
Calcium: 9.7 mg/dL (ref 8.4–10.5)
Chloride: 104 mEq/L (ref 96–112)
Creatinine, Ser: 0.73 mg/dL (ref 0.40–1.20)
GFR: 103.73 mL/min (ref >60.00)
GLUCOSE: 83 mg/dL (ref 70–99)
POTASSIUM: 4.2 meq/L (ref 3.5–5.1)
SODIUM: 139 meq/L (ref 135–145)
TOTAL PROTEIN: 7.6 g/dL (ref 6.0–8.3)
Total Bilirubin: 0.4 mg/dL (ref 0.2–1.2)

## 2018-02-21 LAB — URINALYSIS, ROUTINE W REFLEX MICROSCOPIC
BILIRUBIN URINE: NEGATIVE
Hgb urine dipstick: NEGATIVE
Ketones, ur: NEGATIVE
LEUKOCYTES UA: NEGATIVE
NITRITE: NEGATIVE
RBC / HPF: NONE SEEN (ref 0–?)
Specific Gravity, Urine: 1.02 (ref 1.000–1.030)
TOTAL PROTEIN, URINE-UPE24: NEGATIVE
URINE GLUCOSE: NEGATIVE
UROBILINOGEN UA: 0.2 (ref 0.0–1.0)
pH: 6.5 (ref 5.0–8.0)

## 2018-02-21 LAB — POCT URINE PREGNANCY: Preg Test, Ur: NEGATIVE

## 2018-02-21 MED ORDER — IOPAMIDOL (ISOVUE-300) INJECTION 61%
100.0000 mL | Freq: Once | INTRAVENOUS | Status: AC | PRN
  Administered 2018-02-21: 100 mL via INTRAVENOUS

## 2018-02-21 NOTE — Progress Notes (Signed)
Helen [name] is a 27 y.o. female here to Establish Care.  I acted as a Neurosurgeon for [employer], PA-C Corky Mull, LPN  History of Present Illness:   Chief Complaint  Patient presents with  . Establish Care    left lower back pain radiating down left thigh, fatigue    Acute Concerns: Dysuria, fatigue, lower back pain, nausea, vaginal discharge --  About 1.5 months ago (mid-April) she noticed that she had white vaginal discharge, self treated with a OTC yeast infection medication that essentially resolved a few days later. On May 5th she developed UTI symptoms and while in [place] was treated for "systemic UTI" (?pyelonephritis) and was given Septra DS BID x 10 days from 01/26/18 to 02/05/18 -- she had moderate relief of symptoms. Symptoms progressively worsened on 02/12/18 and she went to [facility] on 02/14/18, where she was treated with Macrobid 100 mg BID x 10 days. She states that she is still on this medication. At [facility], urine was only positive for blood. No culture was performed. She states that she was not informed if a urine culture was performed while she was in [place] at the beginning of May. We are requesting records.  Over the past month, symptoms have gradually worsened. She is having intermittent dysuria, lower back and flank pain (slightly worse on L than R), nausea. Pain radiates down to the back of her L leg at times. She has also been having some diarrhea, no blood, possible related to abx and in no relation to food. She has had urinary frequency, peeing almost every hour per her report. No prior STI or concern for current STI.  Fatigue is ongoing, feels like she needs to sleep for long amounts of time, slept for 14 hours the other day and still felt fatigued. Denies hx of anemia. Patient's last menstrual period was 02/15/2018 (within days). Has a reduced appetite. Intermittent LUQ pain that is relieved after having BM. 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 She also states that she still has a little bit of vaginal discharge that is unusual for her today.  Denies fevers, unusual weight changes. No unusual LE swelling. No recent travel (other than to [place], her home state, at beginning of May.)  Health Maintenance: Immunizations -- UTD Colonoscopy -- N/A Mammogram -- N/A PAP -- 01/2017 Normal, No hx of abnormal pap smears; has IUD Bone Density -- N/A Weight -- Weight: 182 lb 6.1 oz (82.7 kg)  Alcohol -- socially, no excessive Tobacco -- no history  Depression screen [facility] 2/9 02/21/2018  Decreased Interest 0  Down, Depressed, Hopeless 0  PHQ - 2 Score 0    No flowsheet data found.     Past Medical History:  Diagnosis Date  . History of chicken pox   . History of shingles 01/2017     Social History   Socioeconomic History  . Marital status: Single    Spouse name: Not on file  . Number of children: Not on file  . Years of education: Not on file  . Highest education level: Not on file  Occupational History  . Not on file  Social Needs  . Financial resource strain: Not on file  . Food insecurity:    Worry: Not on file    Inability: Not on file  . Transportation  needs:    Medical: Not on file    Non-medical: Not on file  Tobacco Use  . Smoking status: Never Smoker  . Smokeless tobacco: Never Used  Substance and Sexual Activity  . Alcohol use: Yes    Frequency: Never    Comment: once per month socially  . Drug use: No  . Sexual activity: Yes    Birth control/protection: IUD, Condom    Comment: Mirena  Lifestyle  . Physical activity:    Days per week: Not on file    Minutes per session: Not on file  . Stress: Not on file  Relationships  . Social connections:    Talks on phone: Not on file    Gets together: Not on file    Attends religious service: Not on  file    Active member of club or organization: Not on file    Attends meetings of clubs or organizations: Not on file    Relationship status: Not on file  . Intimate partner violence:    Fear of current or ex partner: Not on file    Emotionally abused: Not on file    Physically abused: Not on file    Forced sexual activity: Not on file  Other Topics Concern  . 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 APPENDECTOMY  11/2015    Family History  Problem Relation Age of Onset  . Hypercholesterolemia Mother   . Hypertension Father   . Hypercholesterolemia Maternal Grandmother   . Other Maternal Grandfather        pulmonary valve replacement  . Diabetes Paternal Grandmother     Allergies  Allergen Reactions  . Amoxicillin      Current Medications:   Current Outpatient Medications:  .  citalopram (CELEXA) 20 MG tablet, Take 20 mg by mouth daily., Disp: , Rfl:  .  levonorgestrel (MIRENA, 52 MG,) 20 MCG/24HR IUD, 1 each by Intrauterine route once., Disp: , Rfl:  .  naproxen (NAPROSYN) 500 MG tablet, Take 1 tablet (500 mg total) 2 (two) times daily as needed by mouth for mild pain, moderate pain or headache (TAKE WITH MEALS.)., Disp: 20 tablet, Rfl: 0 .  nitrofurantoin, macrocrystal-monohydrate, (MACROBID) 100 MG capsule, Take 1 capsule (100 mg total) by mouth 2 (two) times daily for 10 days., Disp: 20 capsule, Rfl: 0   Review of Systems:   Review of Systems  Constitutional: Positive for malaise/fatigue. Negative for chills, fever and weight loss.  HENT: Negative.  Negative for hearing loss, sinus pain and sore throat.   Eyes: Negative.  Negative for blurred vision.  Respiratory: Negative.  Negative for cough and shortness of breath.   Cardiovascular: Negative.  Negative for chest pain, palpitations and leg swelling.   Gastrointestinal: Positive for diarrhea and nausea. Negative for abdominal pain, constipation, heartburn and vomiting.  Genitourinary: Positive for dysuria, flank pain (L>R), frequency and hematuria. Negative for urgency.  Musculoskeletal: Negative for back pain, myalgias and neck pain.  Skin: Negative.  Negative for itching and rash.  Neurological: Positive for headaches. Negative for dizziness, tingling, seizures and loss of consciousness.  Endo/Heme/Allergies: Negative.  Negative for polydipsia.  Psychiatric/Behavioral: Negative.  Negative for depression. The patient is not nervous/anxious.     Vitals:   Vitals:   02/21/18 0821  BP: 116/64  Pulse: (!) 58  Temp: 97.9 F (36.6 C)  TempSrc: Oral  SpO2: 98%  Weight: 182 lb 6.1 oz (82.7 kg)  Height: 5' 5.25" (1.657 m)     Body mass index is 30.12 kg/m.  Physical Exam:   Physical Exam  Constitutional: She appears well-developed. She is cooperative.  Non-toxic appearance. She does not have a sickly appearance. She does not appear ill. No distress.  Cardiovascular: Normal rate, regular rhythm, S1 normal, S2 normal, normal heart sounds and normal pulses.  No LE edema  Pulmonary/Chest: Effort normal and breath sounds normal.  Abdominal: Normal appearance and bowel sounds are normal. There is generalized tenderness. There is no rigidity, no rebound, no guarding and no CVA tenderness.  Genitourinary: There is no rash, tenderness or lesion on the right labia. There is no rash, tenderness or lesion on the left labia. Right adnexum displays no mass. Left adnexum displays no mass. No erythema in the vagina. No signs of injury around the vagina. Vaginal discharge (scant white) found.  Musculoskeletal:  Tenderness with palpation to bilateral paraspinal lumbar muscles  Neurological: She is alert. GCS eye subscore is 4. GCS verbal subscore is 5. GCS motor subscore is 6.  Skin: Skin is warm, dry and intact.  Psychiatric: She has a normal mood  and affect. Her speech is normal and behavior is normal.  Nursing note and vitals reviewed.  Results for orders placed or performed in visit on 02/21/18  Urinalysis, Routine w reflex microscopic  Result Value Ref Range   Color, Urine YELLOW Yellow;Lt. Yellow   APPearance CLEAR Clear   Specific Gravity, Urine 1.020 1.000 - 1.030   pH 6.5 5.0 - 8.0   Total Protein, Urine NEGATIVE Negative   Urine Glucose NEGATIVE Negative   Ketones, ur NEGATIVE Negative   Bilirubin Urine NEGATIVE Negative   Hgb urine dipstick NEGATIVE Negative   Urobilinogen, UA 0.2 0.0 - 1.0   Leukocytes, UA NEGATIVE Negative   Nitrite NEGATIVE Negative   WBC, UA 0-2/hpf 0-2/hpf   RBC / HPF none seen 0-2/hpf   Squamous Epithelial / LPF Rare(0-4/hpf) Rare(0-4/hpf)  POCT urine pregnancy  Result Value Ref Range   Preg Test, Ur Negative Negative    Assessment and Plan:    [name] was seen today for establish care.  Diagnoses and all orders for this visit:  Encounter to establish care  Vaginal discharge; Dysuria Urine pregnancy test negative.  We did perform vaginal exam and swabs were obtained for STDs as well as yeast and BV.  Pending at this time.  Urine essentially benign.  I will obtain a urine culture given her recent UTIs, and no prior cultures performed based on her knowledge. We are also requesting records from her provider in [place]. -     Cervicovaginal ancillary only -     Urine Culture  Fatigue, unspecified type; Bilateral flank pain; Nausea We will check a CBC as well as other labs.  Unsure etiology at this time.  I think it is important that we get some imaging to further evaluate.  Differential diagnoses include but are not limited to: ovarian cyst, muscle strain, kidney stone, anemia, cholecystitis, and others.  Patient is agreeable to plan.  I discussed that if symptoms were to worsen, needs to seek medical attention immediately.  Vitals stable today. -     Urinalysis, Routine w reflex  microscopic -     CBC with Differential/Platelet -     Comprehensive metabolic panel -     POCT urine pregnancy -  CT Abdomen Pelvis W Contrast; Future  Screening for HIV (human immunodeficiency virus) -     HIV antibody   . Reviewed expectations re: course of current medical issues. . Discussed self-management of symptoms. . Outlined signs and symptoms indicating need for more acute intervention. . Patient verbalized understanding and all questions were answered. . See orders for this visit as documented in the electronic medical record. . Patient received an After-Visit Summary.   CMA or LPN served as scribe during this visit. History, Physical, and Plan performed by medical provider. Documentation and orders reviewed and attested to.    Jarold Motto, PA-C

## 2018-02-21 NOTE — Patient Instructions (Addendum)
It was great to meet you!  We will call you with your lab results.  Please go to your CT scan as scheduled. We will be in touch about your results.  If you have any worsening pain or symptoms, please seek medical attention immediately.

## 2018-02-22 ENCOUNTER — Telehealth: Payer: Self-pay | Admitting: Physician Assistant

## 2018-02-22 ENCOUNTER — Inpatient Hospital Stay: Admission: RE | Admit: 2018-02-22 | Payer: PRIVATE HEALTH INSURANCE | Source: Ambulatory Visit

## 2018-02-22 ENCOUNTER — Encounter: Payer: Self-pay | Admitting: Physician Assistant

## 2018-02-22 ENCOUNTER — Telehealth: Payer: Self-pay

## 2018-02-22 ENCOUNTER — Ambulatory Visit (INDEPENDENT_AMBULATORY_CARE_PROVIDER_SITE_OTHER): Payer: PRIVATE HEALTH INSURANCE | Admitting: Physician Assistant

## 2018-02-22 VITALS — BP 132/86 | HR 55 | Temp 98.4°F | Ht 65.25 in | Wt 182.4 lb

## 2018-02-22 DIAGNOSIS — R1010 Upper abdominal pain, unspecified: Secondary | ICD-10-CM | POA: Diagnosis not present

## 2018-02-22 DIAGNOSIS — R109 Unspecified abdominal pain: Secondary | ICD-10-CM | POA: Diagnosis not present

## 2018-02-22 DIAGNOSIS — R110 Nausea: Secondary | ICD-10-CM | POA: Diagnosis not present

## 2018-02-22 LAB — URINE CULTURE
MICRO NUMBER:: 90670919
RESULT: NO GROWTH
SPECIMEN QUALITY:: ADEQUATE

## 2018-02-22 LAB — CERVICOVAGINAL ANCILLARY ONLY
BACTERIAL VAGINITIS: NEGATIVE
CANDIDA VAGINITIS: POSITIVE — AB
Chlamydia: NEGATIVE
Neisseria Gonorrhea: NEGATIVE
TRICH (WINDOWPATH): NEGATIVE

## 2018-02-22 LAB — HIV ANTIBODY (ROUTINE TESTING W REFLEX): HIV 1&2 Ab, 4th Generation: NONREACTIVE

## 2018-02-22 NOTE — Patient Instructions (Addendum)
Please get the abdominal and kidney ultrasound.  If any worsening pain, please go to the emergency room!

## 2018-02-22 NOTE — Progress Notes (Signed)
Helen [name] is a 27 y.o. female is here to discuss: RUQ pain  I acted as a Neurosurgeonscribe for Energy East CorporationSamantha Ulis Kaps, PA-C Corky Mullonna Orphanos, LPN  History of Present Illness:   Chief Complaint  Patient presents with  . RUQ pain    Radiating to mid back, and low back pain    HPI   Please see full note from yesterday's encounter 02/21/2018.  CT scan was obtained with essentially benign findings, other than constipation.  Patient is concerned because she is still having symptoms.  She is concerned because in high school she had what sounds like a HIDA scan, which reported that her gallbladder was not functioning at 100%.  She is concerned that she may have gallbladder issues.  She continues to have pain intermittently without relation to food.  She denies any frank hematuria, or any new symptoms since she last saw Helen [name].  She continues to have nausea and bilateral flank pain.  She also is now endorsing sharp pain to her right upper quadrant that radiates to her mid and low back.  She denies any prior history or knowledge of endometriosis.  She denies chest pain or shortness of breath.  Vaginal swab resulted, and does show yeast.  Health Maintenance Due  Topic Date Due  . PAP SMEAR  09/18/2014    Past Medical History:  Diagnosis Date  . History of chicken pox   . History of shingles 01/2017     Social History   Socioeconomic History  . Marital status: Single    Spouse name: Not on file  . Number of children: Not on file  . Years of education: Not on file  . Highest education level: Not on file  Occupational History  . Not on file  Social Needs  . Financial resource strain: Not on file  . Food insecurity:    Worry: Not on file    Inability: Not on file  . Transportation needs:    Medical: Not on file    Non-medical: Not on file  Tobacco Use  . Smoking status: Never Smoker  . Smokeless tobacco: Never Used  Substance and Sexual Activity  . Alcohol use: Yes    Frequency: Never   Comment: once per month socially  . Drug use: No  . Sexual activity: Yes    Birth control/protection: IUD, Condom    Comment: Mirena  Lifestyle  . Physical activity:    Days per week: Not on file    Minutes per session: Not on file  . Stress: Not on file  Relationships  . Social connections:    Talks on phone: Not on file    Gets together: Not on file    Attends religious service: Not on file    Active member of club or organization: Not on file    Attends meetings of clubs or organizations: Not on file    Relationship status: Not on file  . Intimate partner violence:    Fear of current or ex partner: Not on file    Emotionally abused: Not on file    Physically abused: Not on file    Forced sexual activity: Not on file  Other Topics Concern  . 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 APPENDECTOMY  11/2015    Family History  Problem Relation  Age of Onset  . Hypercholesterolemia Mother   . Hypertension Father   . Hypercholesterolemia Maternal Grandmother   . Other Maternal Grandfather        pulmonary valve replacement  . Diabetes Paternal Grandmother     PMHx, SurgHx, SocialHx, FamHx, Medications, and Allergies were reviewed in the Visit Navigator and updated as appropriate.   Patient Active Problem List   Diagnosis Date Noted  . Anxiety 02/21/2018  . PTSD (post-traumatic stress disorder) 02/21/2018  . Pain in right ankle and joints of right foot 08/01/2017    Social History   Tobacco Use  . Smoking status: Never Smoker  . Smokeless tobacco: Never Used  Substance Use Topics  . Alcohol use: Yes    Frequency: Never    Comment: once per month socially  . Drug use: No    Current Medications and Allergies:    Current Outpatient Medications:  .  citalopram (CELEXA) 20 MG tablet, Take 20 mg by mouth daily., Disp:  , Rfl:  .  levonorgestrel (MIRENA, 52 MG,) 20 MCG/24HR IUD, 1 each by Intrauterine route once., Disp: , Rfl:  .  naproxen (NAPROSYN) 500 MG tablet, Take 1 tablet (500 mg total) 2 (two) times daily as needed by mouth for mild pain, moderate pain or headache (TAKE WITH MEALS.)., Disp: 20 tablet, Rfl: 0 .  nitrofurantoin, macrocrystal-monohydrate, (MACROBID) 100 MG capsule, Take 1 capsule (100 mg total) by mouth 2 (two) times daily for 10 days., Disp: 20 capsule, Rfl: 0   Allergies  Allergen Reactions  . Amoxicillin     Review of Systems   ROS  Negative unless otherwise specified per HPI.   Vitals:   Vitals:   02/22/18 1439  BP: 132/86  Pulse: (!) 55  Temp: 98.4 F (36.9 C)  TempSrc: Oral  SpO2: 99%  Weight: 182 lb 6.1 oz (82.7 kg)  Height: 5' 5.25" (1.657 m)     Body mass index is 30.12 kg/m.   Physical Exam:    Physical Exam  Constitutional: She appears well-developed. She is cooperative.  Non-toxic appearance. She does not have a sickly appearance. She does not appear ill. No distress.  Cardiovascular: Normal rate, regular rhythm, S1 normal, S2 normal, normal heart sounds and normal pulses.  No LE edema  Pulmonary/Chest: Effort normal and breath sounds normal.  Abdominal: Normal appearance and bowel sounds are normal. There is tenderness in the right upper quadrant. There is positive Murphy's sign. There is no rigidity, no rebound, no guarding (mild), no CVA tenderness and no tenderness at McBurney's point.  Neurological: She is alert. GCS eye subscore is 4. GCS verbal subscore is 5. GCS motor subscore is 6.  Skin: Skin is warm, dry and intact.  Psychiatric: She has a normal mood and affect. Her speech is normal and behavior is normal.  Nursing note and vitals reviewed.    Assessment and Plan:    [name] was seen today for ruq pain.  Diagnoses and all orders for this visit:  Bilateral flank pain; Nausea; Pain of upper abdomen Discussed CT results with patient.   She would like to pursue further work-up as her symptoms persist.  Discussed briefly with Dr. Orland Mustard in office today, we briefly entertained the idea of doing a HIDA scan versus an abdominal ultrasound.  We will go ahead and proceed with an abdominal ultrasound which will include a picture of her kidneys.  If negative results we may need to pursue a GI consult for further evaluation.  Patient  verbalized understanding.  I also discussed with her that if her symptoms worsen, she needs to go to the emergency room.  Patient verbalized understanding was appreciated with plan of care. -     US Renal; Future -     US Abdomen Complete; Future    . Reviewed expectations re: course of current medical issues. . Discussed self-management of symptoms. . Outlined signs and symptoms indicating need for more acute intervention. . Patient verbalized understanding and all questions were answered. . See orders for this visit as documented in the electronic medical record. . Patient received an After Visit Summary.  CMA or LPN served as scribe during this visit. History, Physical, and Plan performed by medical provider. Documentation and orders reviewed and attested to.   Jarold Motto, PA-C [place] 02/23/2018  Follow-up: No follow-ups on file.

## 2018-02-22 NOTE — Telephone Encounter (Signed)
Received call from Destiny at [facility] [place].  Pt has appt tomorrow afternoon at 1:30 p.m. For complete US of abdomen and renal US.  Per Destiny, view of kidneys are included in complete abdominal US.  If needing view of bladder, would need to order limited US of pelvis.  Please advise.

## 2018-02-22 NOTE — Telephone Encounter (Signed)
Patient called for clarification of imaging results  [facility] conference with Lupita LeashDonna at  [facility] who spoke with patient

## 2018-02-23 ENCOUNTER — Encounter: Payer: Self-pay | Admitting: Physician Assistant

## 2018-02-23 ENCOUNTER — Ambulatory Visit (HOSPITAL_BASED_OUTPATIENT_CLINIC_OR_DEPARTMENT_OTHER)
Admission: RE | Admit: 2018-02-23 | Discharge: 2018-02-23 | Disposition: A | Discharge status: Home / Self Care | Payer: No Typology Code available for payment source | Source: Ambulatory Visit | Attending: Physician Assistant | Admitting: Physician Assistant

## 2018-02-23 ENCOUNTER — Other Ambulatory Visit: Payer: Self-pay | Admitting: Physician Assistant

## 2018-02-23 ENCOUNTER — Ambulatory Visit (HOSPITAL_BASED_OUTPATIENT_CLINIC_OR_DEPARTMENT_OTHER): Admission: RE | Admit: 2018-02-23 | Payer: No Typology Code available for payment source | Source: Ambulatory Visit

## 2018-02-23 DIAGNOSIS — R109 Unspecified abdominal pain: Secondary | ICD-10-CM | POA: Insufficient documentation

## 2018-02-23 DIAGNOSIS — R110 Nausea: Secondary | ICD-10-CM | POA: Diagnosis not present

## 2018-02-23 DIAGNOSIS — R1010 Upper abdominal pain, unspecified: Secondary | ICD-10-CM | POA: Diagnosis not present

## 2018-02-23 MED ORDER — FLUCONAZOLE 150 MG PO TABS: 150.0000 mg | ORAL_TABLET | Freq: Once | ORAL | 0 refills | Status: AC

## 2018-02-23 NOTE — Telephone Encounter (Signed)
Thank you for this message.  I am in agreement to discontinue the renal ultrasound, and only perform a complete abdominal ultrasound today.  I have attempted to discontinue this order however will not allow me to.  Please call [facility] imaging and provide verbal order to discontinue the renal ultrasound.  Jarold MottoSamantha Shaneequa Bahner PA-C 02/23/18

## 2018-02-23 NOTE — Telephone Encounter (Signed)
Spoke to Chub[facility] CorporationDestiny at [facility] [place].  Per Jarold MottoSamantha Worley, PA-C, gave verbal order to cancel renal US for pt that was scheduled today 02/23/18.  Destiny will cancel order and pt will have complete US of abdomen only.

## 2018-02-24 ENCOUNTER — Encounter: Payer: Self-pay | Admitting: Physician Assistant

## 2018-02-24 ENCOUNTER — Telehealth: Payer: Self-pay | Admitting: *Deleted

## 2018-02-24 ENCOUNTER — Other Ambulatory Visit: Payer: Self-pay | Admitting: Physician Assistant

## 2018-02-24 DIAGNOSIS — R110 Nausea: Secondary | ICD-10-CM

## 2018-02-24 DIAGNOSIS — R1010 Upper abdominal pain, unspecified: Secondary | ICD-10-CM

## 2018-02-24 DIAGNOSIS — R109 Unspecified abdominal pain: Secondary | ICD-10-CM

## 2018-02-24 NOTE — Telephone Encounter (Signed)
See note

## 2018-02-24 NOTE — Telephone Encounter (Signed)
Pt aware see result notes. 

## 2018-02-24 NOTE — Telephone Encounter (Signed)
See note.   Copied from CRM [id number]. Topic: Referral - Question >> Feb 24, 2018  3:41 PM Alexander BergeronBarksdale, Harvey B wrote: Reason for CRM: pt called about the urgent referral; pt states she cannot get in until the 20th of June; pt is trying to get in quicker or have testing done; call pt to advise

## 2018-02-24 NOTE — Telephone Encounter (Signed)
Result note read to patient; verbalizes understanding.  Pt states she is "waiting to hear U/S results and HIDA scan scheduling."   Please advise: [phone]

## 2018-02-27 ENCOUNTER — Encounter (INDEPENDENT_AMBULATORY_CARE_PROVIDER_SITE_OTHER): Payer: Self-pay

## 2018-02-27 ENCOUNTER — Other Ambulatory Visit: Payer: Self-pay | Admitting: Physician Assistant

## 2018-02-27 DIAGNOSIS — R1011 Right upper quadrant pain: Secondary | ICD-10-CM

## 2018-02-27 NOTE — Progress Notes (Signed)
hid 

## 2018-03-01 ENCOUNTER — Encounter (HOSPITAL_COMMUNITY)
Admission: RE | Admit: 2018-03-01 | Discharge: 2018-03-01 | Disposition: A | Discharge status: Home / Self Care | Payer: No Typology Code available for payment source | Source: Ambulatory Visit | Attending: Physician Assistant | Admitting: Physician Assistant

## 2018-03-01 DIAGNOSIS — R1011 Right upper quadrant pain: Secondary | ICD-10-CM | POA: Insufficient documentation

## 2018-03-01 MED ORDER — TECHNETIUM TC 99M MEBROFENIN IV KIT
5.5000 | PACK | Freq: Once | INTRAVENOUS | Status: AC | PRN
  Administered 2018-03-01: 5.5 via INTRAVENOUS

## 2018-03-08 ENCOUNTER — Encounter: Payer: Self-pay | Admitting: Physician Assistant

## 2018-03-08 LAB — HEPATITIS B SURFACE ANTIGEN: HEPATITIS B SURFACE ANTIGEN: NONREACTIVE

## 2018-03-08 LAB — LIPASE: Lipase: 19

## 2018-03-08 LAB — HSV(HERPES SIMPLEX VRS) I + II AB-IGG: HSV I/II IGG: NEGATIVE

## 2018-03-08 LAB — HEPATITIS A ANTIBODY, IGM: Hep A IgM: NEGATIVE

## 2018-03-08 LAB — HEPATITIS C ANTIBODY: HEP C AB: NEGATIVE

## 2018-03-08 LAB — GC/CHLAMYDIA PROBE AMP: GC/Chlamydia probe amp (CH): NEGATIVE

## 2018-03-09 ENCOUNTER — Ambulatory Visit (INDEPENDENT_AMBULATORY_CARE_PROVIDER_SITE_OTHER): Payer: No Typology Code available for payment source | Admitting: Physician Assistant

## 2018-03-09 ENCOUNTER — Encounter: Payer: Self-pay | Admitting: Physician Assistant

## 2018-03-09 ENCOUNTER — Other Ambulatory Visit: Payer: PRIVATE HEALTH INSURANCE

## 2018-03-09 ENCOUNTER — Encounter

## 2018-03-09 VITALS — BP 100/70 | HR 51 | Ht 65.25 in | Wt 184.0 lb

## 2018-03-09 DIAGNOSIS — R1013 Epigastric pain: Secondary | ICD-10-CM | POA: Diagnosis not present

## 2018-03-09 DIAGNOSIS — R5383 Other fatigue: Secondary | ICD-10-CM | POA: Diagnosis not present

## 2018-03-09 DIAGNOSIS — R110 Nausea: Secondary | ICD-10-CM | POA: Diagnosis not present

## 2018-03-09 DIAGNOSIS — R1011 Right upper quadrant pain: Secondary | ICD-10-CM | POA: Diagnosis not present

## 2018-03-09 MED ORDER — OMEPRAZOLE 40 MG PO CPDR: 40.0000 mg | DELAYED_RELEASE_CAPSULE | Freq: Every day | ORAL | 2 refills | Status: DC

## 2018-03-09 NOTE — Addendum Note (Signed)
Addended by: Zachary GeorgeMORAYATI, Lera Gaines G on: 03/09/2018 10:36 AM   Modules accepted: Orders

## 2018-03-09 NOTE — Patient Instructions (Addendum)
If you are age 25 or older, your body mass index should be between 23-30. Your Body mass index is 30.39 kg/m. If this is out of the aforementioned range listed, please consider follow up with your Primary Care Provider.  If you are age 25 or younger, your body mass index should be between 19-25. Your Body mass index is 30.39 kg/m. If this is out of the aformentioned range listed, please consider follow up with your Primary Care Provider.   You have been scheduled for an endoscopy. Please follow written instructions given to you at your visit today. If you use inhalers (even only as needed), please bring them with you on the day of your procedure. Your physician has requested that you go to www.startemmi.com and enter the access code given to you at your visit today. This web site gives a general overview about your procedure. However, you should still follow specific instructions given to you by our office regarding your preparation for the procedure.  Your provider has requested that you go to the basement level for lab work before leaving today. Press "B" on the elevator. The lab is located at the first door on the left as you exit the elevator.  We have sent the following medications to your pharmacy for you to pick up at your convenience: Omeprazole 40 mg  Thank you for choosing me and [facility].   Helen BusmanJenifer Lemmon, PA-C

## 2018-03-09 NOTE — Progress Notes (Signed)
Chief Complaint: Abdominal pain, fatigue, nausea  HPI:    Mrs. Boston Service is a 27 year old Caucasian female with a past medical history as listed below, who was referred to me by Jarold Motto, PA for a complaint of abdominal pain, fatigue and nausea.      02/21/2018 CMP and CBC were normal.  Urinalysis normal.  Patient was found to have a yeast infection 02/23/2018 and treated with a one-time Diflucan tablet.  Urine pregnancy test negative.    02/21/2018 CT abdomen pelvis with contrast with diffuse stool throughout the colon, no bowel obstruction, no evidence of abscess in the abdomen or pelvis, appendix absent, no evident renal or ureteral calculus.  No hydronephrosis.  Urinary bladder wall was not thickened, intrauterine device positioned within the endometrium.    02/23/2018 ultrasound abdomen was normal.    03/01/2018 HIDA scan with [facility].  Patient had normal ejection fraction.  Did experience abdominal pain with oral Ensure consumption.  Cystic and common bile ducts were patent.    Today, explains that about 2 months ago she was diagnosed with a yeast infection and treated for this.  Around that time she started to become severely fatigued and found herself sleeping a lot which is "just not like me".  Patient describes 10 hours of sleep at night and naps during the day.  She went to an urgent care and was diagnosed with the UTI and placed on Bactrim for 10 days, she did not improve at all and continued with fatigue and started with some nausea.  She proceeded back to urgent care and was told she had blood in her urine most was thought related to her..  This right upper quadrant pain became more severe and radiated down to her sides and into her back.  She is continued with this 6-7/10 pain which sometimes wakes her up in her sleep at night and can occur throughout the day, typically worse after eating.  Patient has tried to eliminate certain foods from her diet and she continues with symptoms with  symptoms no matter what she eats.  Associated symptoms include nausea and occasional vomiting into her mouth.  Patient is concerned that she just "does not feel good".    Patient's social history is positive for her family living in [place].  Her mother is a Engineer, civil (consulting).    Denies fever, chills, weight loss, blood in her stool or melena.  Past Medical History:  Diagnosis Date  . History of chicken pox   . History of shingles 01/2017    Past Surgical History:  Procedure Laterality Date  . APPENDECTOMY  11/2015    Current Outpatient Medications  Medication Sig Dispense Refill  . citalopram (CELEXA) 20 MG tablet Take 20 mg by mouth daily.    Marland Kitchen levonorgestrel (MIRENA, 52 MG,) 20 MCG/24HR IUD 1 each by Intrauterine route once.    . naproxen (NAPROSYN) 500 MG tablet Take 1 tablet (500 mg total) 2 (two) times daily as needed by mouth for mild pain, moderate pain or headache (TAKE WITH MEALS.). 20 tablet 0   No current facility-administered medications for this visit.     Allergies as of 03/09/2018 - Review Complete 03/09/2018  Allergen Reaction Noted  . Amoxicillin  02/14/2018    Family History  Problem Relation Age of Onset  . Hypercholesterolemia Mother   . Hypertension Father   . Hypercholesterolemia Maternal Grandmother   . Other Maternal Grandfather        pulmonary valve replacement  . Diabetes  Paternal Grandmother     Social History   Socioeconomic History  . Marital status: Single    Spouse name: Not on file  . Number of children: Not on file  . Years of education: Not on file  . Highest education level: Not on file  Occupational History  . Not on file  Social Needs  . Financial resource strain: Not on file  . Food insecurity:    Worry: Not on file    Inability: Not on file  . Transportation needs:    Medical: Not on file    Non-medical: Not on file  Tobacco Use  . Smoking status: Never Smoker  . Smokeless tobacco: Never Used  Substance and Sexual Activity  .  Alcohol use: Yes    Frequency: Never    Comment: once per month socially  . Drug use: No  . Sexual activity: Yes    Birth control/protection: IUD, Condom    Comment: Mirena  Lifestyle  . Physical activity:    Days per week: Not on file    Minutes per session: Not on file  . Stress: Not on file  Relationships  . Social connections:    Talks on phone: Not on file    Gets together: Not on file    Attends religious service: Not on file    Active member of club or organization: Not on file    Attends meetings of clubs or organizations: Not on file    Relationship status: Not on file  . Intimate partner violence:    Fear of current or ex partner: Not on file    Emotionally abused: Not on file    Physically abused: Not on file    Forced sexual activity: Not on file  Other Topics Concern  . 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, masters in [employer]   [place]   Likes to workout and lift    Review of Systems:    Constitutional: No weight loss, fever, chills, weakness or fatigue HEENT: Eyes: No change in vision               Ears, Nose, Throat:  No change in hearing or congestion Skin: No rash or itching Cardiovascular: No chest pain, chest pressure or palpitations   Respiratory: No SOB or cough Gastrointestinal: See HPI and otherwise negative Genitourinary: No dysuria or change in urinary frequency Neurological: No headache, dizziness or syncope Musculoskeletal: No new muscle or joint pain Hematologic: No bleeding or bruising Psychiatric: No history of depression or anxiety    Physical Exam:  Vital signs: BP 100/70   Pulse (!) 51   Ht 5' 5.25" (1.657 m)   Wt 184 lb (83.5 kg)   LMP 02/15/2018 (Within Days)   BMI 30.39 kg/m   Constitutional:   Pleasant Caucasian female appears to be in NAD, Well developed, Well nourished, alert and cooperative Head:  Normocephalic and atraumatic. Eyes:   PEERL, EOMI. No icterus. Conjunctiva pink. Ears:   Normal auditory acuity. Neck:  Supple Throat: Oral cavity and pharynx without inflammation, swelling or lesion.  Respiratory: Respirations even and unlabored. Lungs clear to auscultation bilaterally.   No wheezes, crackles, or rhonchi.  Cardiovascular: Normal S1, S2. No MRG. Regular rate and rhythm. No peripheral edema, cyanosis or pallor.  Gastrointestinal:  Soft, nondistended, nontender. No rebound or guarding. Normal bowel sounds. No appreciable masses or hepatomegaly. Rectal:  Not performed.  Msk:  Symmetrical without gross deformities. Without edema,  no deformity or joint abnormality.  Neurologic:  Alert and  oriented x4;  grossly normal neurologically.  Skin:   Dry and intact without significant lesions or rashes. Psychiatric: Oriented to person, place and time. Demonstrates good judgement and reason without abnormal affect or behaviors.  RELEVANT LABS AND IMAGING: CBC    Component Value Date/Time   WBC 6.8 02/21/2018 0902   RBC 4.65 02/21/2018 0902   HGB 14.8 02/21/2018 0902   HCT 43.5 02/21/2018 0902   PLT 290.0 02/21/2018 0902   MCV 93.5 02/21/2018 0902   MCHC 34.0 02/21/2018 0902   RDW 13.0 02/21/2018 0902   LYMPHSABS 2.1 02/21/2018 0902   MONOABS 0.4 02/21/2018 0902   EOSABS 0.2 02/21/2018 0902   BASOSABS 0.1 02/21/2018 0902    CMP     Component Value Date/Time   NA 139 02/21/2018 0902   NA 140 12/09/2015   K 4.2 02/21/2018 0902   CL 104 02/21/2018 0902   CO2 27 02/21/2018 0902   GLUCOSE 83 02/21/2018 0902   BUN 20 02/21/2018 0902   BUN 14 12/09/2015   CREATININE 0.73 02/21/2018 0902   CALCIUM 9.7 02/21/2018 0902   PROT 7.6 02/21/2018 0902   ALBUMIN 4.8 02/21/2018 0902   AST 20 02/21/2018 0902   ALT 21 02/21/2018 0902   ALKPHOS 80 02/21/2018 0902   BILITOT 0.4 02/21/2018 0902   Imaging: See HPI  Assessment: 1.  Abdominal pain: For 2 months, epigastric/right upper quadrant, gallbladder work-up as above, some biliary dyskinesia; etiology versus  biliary 2.  Nausea: For 2 months with above, worse when eating; consider gastritis versus H. pylori versus biliary dyskinesia 3.  Fatigue: Over the past 2 months per the patient; consider relation to mononucleosis versus H. pylori versus other  Plan: 1.  Ordered EBV IgM for further evaluation of possible mono.  Also ordered H. pylori antibody. 2.  Scheduled patient for an EGD with Dr. Adela Lank.  Added her to a 730 slot in early July per his recommendation.  Did discuss risk and benefits, limitations alternatives and patient agrees to proceed. 3.  Prescribed Omeprazole 40 mg daily, 30-60 minutes before dinner.  #30 with 1 refill 4.  Patient to follow in clinic per recommendations after labs/EGD as above.  If all of this testing is negative/normal, could consider referral to [facility] for biliary dyskinesia.  Hyacinth Meeker, PA-C [facility] 03/09/2018, 10:23 AM  Cc: Jarold Motto, [place]

## 2018-03-10 LAB — EPSTEIN-BARR VIRUS VCA, IGM

## 2018-03-10 NOTE — Progress Notes (Signed)
Agree with assessment and plan as outlined. Will await EGD and course on PPI.

## 2018-03-13 LAB — HELICOBACTER PYLORI  ANTIBODY, IGM: H pylori, IgM Abs: 9.6 units — ABNORMAL HIGH (ref 0.0–8.9)

## 2018-03-21 ENCOUNTER — Encounter: Payer: Self-pay | Admitting: Gastroenterology

## 2018-03-27 ENCOUNTER — Other Ambulatory Visit: Payer: Self-pay

## 2018-03-27 ENCOUNTER — Ambulatory Visit (AMBULATORY_SURGERY_CENTER): Payer: No Typology Code available for payment source | Admitting: Gastroenterology

## 2018-03-27 ENCOUNTER — Encounter: Payer: Self-pay | Admitting: Gastroenterology

## 2018-03-27 VITALS — BP 109/58 | HR 45 | Temp 98.4°F | Resp 15 | Ht 65.25 in | Wt 184.0 lb

## 2018-03-27 DIAGNOSIS — R1013 Epigastric pain: Secondary | ICD-10-CM

## 2018-03-27 DIAGNOSIS — K3189 Other diseases of stomach and duodenum: Secondary | ICD-10-CM

## 2018-03-27 MED ORDER — SODIUM CHLORIDE 0.9 % IV SOLN: 500.0000 mL | Freq: Once | INTRAVENOUS | Status: AC

## 2018-03-27 NOTE — Patient Instructions (Signed)
Consider trial Omeprazole twice daily.  YOU HAD AN ENDOSCOPIC PROCEDURE TODAY AT THE [facility]:   Refer to the procedure report that was given to you for any specific questions about what was found during the examination.  If the procedure report does not answer your questions, please call your gastroenterologist to clarify.  If you requested that your care partner not be given the details of your procedure findings, then the procedure report has been included in a sealed envelope for you to review at your convenience later.  YOU SHOULD EXPECT: Some feelings of bloating in the abdomen. Passage of more gas than usual.  Walking can help get rid of the air that was put into your GI tract during the procedure and reduce the bloating. If you had a lower endoscopy (such as a colonoscopy or flexible sigmoidoscopy) you may notice spotting of blood in your stool or on the toilet paper. If you underwent a bowel prep for your procedure, you may not have a normal bowel movement for a few days.  Please Note:  You might notice some irritation and congestion in your nose or some drainage.  This is from the oxygen used during your procedure.  There is no need for concern and it should clear up in a day or so.  SYMPTOMS TO REPORT IMMEDIATELY:    Following upper endoscopy (EGD)  Vomiting of blood or coffee ground material  New chest pain or pain under the shoulder blades  Painful or persistently difficult swallowing  New shortness of breath  Fever of 100F or higher  Black, tarry-looking stools  For urgent or emergent issues, a gastroenterologist can be reached at any hour by calling [phone].   DIET:  We do recommend a small meal at first, but then you may proceed to your regular diet.  Drink plenty of fluids but you should avoid alcoholic beverages for 24 hours.  ACTIVITY:  You should plan to take it easy for the rest of today and you should NOT DRIVE or use heavy machinery until  tomorrow (because of the sedation medicines used during the test).    FOLLOW UP: Our staff will call the number listed on your records the next business day following your procedure to check on you and address any questions or concerns that you may have regarding the information given to you following your procedure. If we do not reach you, we will leave a message.  However, if you are feeling well and you are not experiencing any problems, there is no need to return our call.  We will assume that you have returned to your regular daily activities without incident.  If any biopsies were taken you will be contacted by phone or by letter within the next 1-3 weeks.  Please call us at [phone] if you have not heard about the biopsies in 3 weeks.    SIGNATURES/CONFIDENTIALITY: You and/or your care partner have signed paperwork which will be entered into your electronic medical record.  These signatures attest to the fact that that the information above on your After Visit Summary has been reviewed and is understood.  Full responsibility of the confidentiality of this discharge information lies with you and/or your care-partner.

## 2018-03-27 NOTE — Progress Notes (Signed)
Pt's states no medical or surgical changes since previsit or office visit. 

## 2018-03-27 NOTE — Progress Notes (Signed)
Called to room to assist during endoscopic procedure.  Patient ID and intended procedure confirmed with present staff. Received instructions for my participation in the procedure from the performing physician.  

## 2018-03-27 NOTE — Progress Notes (Signed)
A/ox3 pleased with MAC, report to RN 

## 2018-03-27 NOTE — Op Note (Signed)
[facility] Patient Name: Helen [name] Procedure Date: 03/27/2018 7:40 AM MRN: [id number] Endoscopist: Viviann Spare P. Adela Lank , MD Age: 27 Referring MD:  Date of Birth: [birth year] Gender: Female Account #: [id number] Procedure:                Upper GI endoscopy Indications:              Upper abdominal pain and nausea - negative CT scan,                            negative [place], negative HIDA scan. Trial of                            omeprazole 40mg  once daily has provided some mild                            benefit but symptoms largest persist, H pylori IgM                            is equivocal Medicines:                Monitored Anesthesia Care Procedure:                Pre-Anesthesia Assessment:                           - Prior to the procedure, a History and Physical                            was performed, and patient medications and                            allergies were reviewed. The patient's tolerance of                            previous anesthesia was also reviewed. The risks                            and benefits of the procedure and the sedation                            options and risks were discussed with the patient.                            All questions were answered, and informed consent                            was obtained. Prior Anticoagulants: The patient has                            taken no previous anticoagulant or antiplatelet                            agents. ASA Grade Assessment: II - A patient with  mild systemic disease. After reviewing the risks                            and benefits, the patient was deemed in                            satisfactory condition to undergo the procedure.                           After obtaining informed consent, the endoscope was                            passed under direct vision. Throughout the                            procedure, the patient's blood pressure,  pulse, and                            oxygen saturations were monitored continuously. The                            Endoscope was introduced through the mouth, and                            advanced to the second part of duodenum. The upper                            GI endoscopy was accomplished without difficulty.                            The patient tolerated the procedure well. Scope In: Scope Out: Findings:                 Esophagogastric landmarks were identified: the                            Z-line was found at 38 cm, the gastroesophageal                            junction was found at 38 cm and the upper extent of                            the gastric folds was found at 38 cm from the                            incisors.                           The exam of the esophagus was otherwise normal. No                            erosive changes.                           The entire  examined stomach was normal. No evidence                            of ulceration.                           Biopsies were taken with a cold forceps in the                            gastric body, at the incisura and in the gastric                            antrum for Helicobacter pylori testing.                           The duodenal bulb and second portion of the                            duodenum were normal. Biopsies for histology were                            taken with a cold forceps for evaluation of celiac                            disease. Complications:            No immediate complications. Estimated blood loss:                            Minimal. Estimated Blood Loss:     Estimated blood loss was minimal. Impression:               - Esophagogastric landmarks identified.                           - Normal esophagus.                           - Normal stomach. Biopsied to rule out H pylori.                           - Normal duodenal bulb and second portion of the                             duodenum. Biopsied to rule out celiac disease. Recommendation:           - Patient has a contact number available for                            emergencies. The signs and symptoms of potential                            delayed complications were discussed with the                            patient. Return to normal activities tomorrow.  Written discharge instructions were provided to the                            patient.                           - Resume previous diet.                           - Continue present medications.                           - Consideration for trial of omeprazole twice daily                            to treat for dyspepsia given some mild improvement                            so far                           - Await pathology results with further                            recommendations. Viviann Spare P. Coron Rossano, MD 03/27/2018 7:58:38 AM This report has been signed electronically.

## 2018-03-28 ENCOUNTER — Telehealth: Payer: Self-pay | Admitting: *Deleted

## 2018-03-28 ENCOUNTER — Telehealth: Payer: Self-pay

## 2018-03-28 NOTE — Telephone Encounter (Signed)
Called [phone] and left a messaged we tried to reach pt for a follow up call. maw

## 2018-03-28 NOTE — Telephone Encounter (Signed)
  Follow up Call-  Call back number 03/27/2018  Post procedure Call Back phone  # [phone]  Permission to leave phone message Yes     Patient questions:  Do you have a fever, pain , or abdominal swelling? No. Pain Score  0 *  Have you tolerated food without any problems? Yes.    Have you been able to return to your normal activities? Yes.    Do you have any questions about your discharge instructions: Diet   No. Medications  No. Follow up visit  No.  Do you have questions or concerns about your Care? No.  Actions: * If pain score is 4 or above: No action needed, pain <4.

## 2018-04-05 ENCOUNTER — Other Ambulatory Visit: Payer: Self-pay

## 2018-04-05 MED ORDER — OMEPRAZOLE 40 MG PO CPDR: 40.0000 mg | DELAYED_RELEASE_CAPSULE | Freq: Two times a day (BID) | ORAL | 2 refills | Status: DC | PRN

## 2018-05-28 ENCOUNTER — Encounter: Payer: Self-pay | Admitting: Physician Assistant

## 2018-06-02 ENCOUNTER — Ambulatory Visit: Payer: PRIVATE HEALTH INSURANCE | Admitting: Physician Assistant

## 2018-06-09 ENCOUNTER — Ambulatory Visit (INDEPENDENT_AMBULATORY_CARE_PROVIDER_SITE_OTHER): Payer: PRIVATE HEALTH INSURANCE | Admitting: Physician Assistant

## 2018-06-09 ENCOUNTER — Other Ambulatory Visit (HOSPITAL_COMMUNITY)
Admission: RE | Admit: 2018-06-09 | Discharge: 2018-06-09 | Disposition: A | Discharge status: Home / Self Care | Payer: No Typology Code available for payment source | Source: Ambulatory Visit | Attending: Physician Assistant | Admitting: Physician Assistant

## 2018-06-09 ENCOUNTER — Encounter: Payer: Self-pay | Admitting: Physician Assistant

## 2018-06-09 VITALS — BP 126/80 | HR 65 | Temp 98.8°F | Ht 65.25 in | Wt 185.2 lb

## 2018-06-09 DIAGNOSIS — R300 Dysuria: Secondary | ICD-10-CM

## 2018-06-09 DIAGNOSIS — N761 Subacute and chronic vaginitis: Secondary | ICD-10-CM | POA: Insufficient documentation

## 2018-06-09 DIAGNOSIS — Z23 Encounter for immunization: Secondary | ICD-10-CM

## 2018-06-09 DIAGNOSIS — Z79899 Other long term (current) drug therapy: Secondary | ICD-10-CM | POA: Diagnosis not present

## 2018-06-09 DIAGNOSIS — N898 Other specified noninflammatory disorders of vagina: Secondary | ICD-10-CM | POA: Diagnosis present

## 2018-06-09 DIAGNOSIS — F419 Anxiety disorder, unspecified: Secondary | ICD-10-CM

## 2018-06-09 LAB — POCT URINALYSIS DIPSTICK
Bilirubin, UA: NEGATIVE
Glucose, UA: NEGATIVE
Ketones, UA: NEGATIVE
Nitrite, UA: NEGATIVE
PH UA: 8 (ref 5.0–8.0)
Protein, UA: POSITIVE — AB
SPEC GRAV UA: 1.01 (ref 1.010–1.025)
UROBILINOGEN UA: 0.2 U/dL

## 2018-06-09 LAB — POCT URINE PREGNANCY: Preg Test, Ur: NEGATIVE

## 2018-06-09 MED ORDER — CITALOPRAM HYDROBROMIDE 10 MG PO TABS: 10.0000 mg | ORAL_TABLET | Freq: Every day | ORAL | 0 refills | Status: AC

## 2018-06-09 MED ORDER — NITROFURANTOIN MONOHYD MACRO 100 MG PO CAPS: 100.0000 mg | ORAL_CAPSULE | Freq: Two times a day (BID) | ORAL | 0 refills | Status: DC

## 2018-06-09 MED ORDER — FLUCONAZOLE 150 MG PO TABS: 150.0000 mg | ORAL_TABLET | Freq: Once | ORAL | 3 refills | Status: AC

## 2018-06-09 NOTE — Patient Instructions (Signed)
It was great to see you!  Take the oral diflucan tablet today.  Consider using the RepHresh gel, pictured below.     Let's get you to ob-gyn for further evaluation and treatment. I will be in touch as soon as your urine and swabs have returned.  Take care,  Jarold MottoSamantha Floretta Petro PA-C

## 2018-06-09 NOTE — Progress Notes (Signed)
Helen [name] is a 27 y.o. female here for a follow up of a pre-existing problem.  I acted as a Neurosurgeonscribe for [name], PA-C Corky Mullonna Orphanos, LPN  History of Present Illness:   Chief Complaint  Patient presents with  . Vaginal Discharge    Vaginal Discharge  The patient's primary symptoms include vaginal discharge. This is a chronic problem. Episode onset: Pt says it has been off and on all summer. The problem occurs constantly. The problem has been waxing and waning. The pain is moderate. The problem affects both sides. She is not pregnant. Associated symptoms include abdominal pain, back pain (Upper right), diarrhea, discolored urine, dysuria (skin irritation) and painful intercourse. The vaginal discharge was thick and bloody (light pink, clumpy). Vaginal bleeding amount: Pt has an IUD, spotting, had heavy bleeding with clots x 1 day and cramps. She has not been passing clots. She has not been passing tissue. The symptoms are aggravated by urinating. She has tried NSAIDs (Monistat and Daily Probiotics) for the symptoms. The treatment provided no relief. She is sexually active. No, her partner does not have an STD. She uses an IUD for contraception. Menstrual history: Spotting off and on, IUD. There is no history of endometriosis, miscarriage, ovarian cysts, PID, an STD or a terminated pregnancy.   She has undergone extensive work-up with GI for abdominal pain over the summer. Includes CT abdomen pelvis with contrast, ultrasound, HID scan with CCK. All studies were essentially normal.  Does have some bleeding after sex and consistent cramping after sex. No hx of endometriosis. [place] planning to see in December but would like to see one sooner if possible. Has had some urinary frequency.  No LMP recorded. (Menstrual status: IUD).  Anxiety Currently doing [name] on Celexa 20 mg, is interested in decreasing to 10 mg. Denies SI/HI.  Past Medical History:   Diagnosis Date  . Anxiety   . History of chicken pox   . History of shingles 01/2017     Social History   Socioeconomic History  . Marital status: Single    Spouse name: Not on file  . Number of children: Not on file  . Years of education: Not on file  . Highest education level: Not on file  Occupational History  . Not on file  Social Needs  . Financial resource strain: Not on file  . Food insecurity:    Worry: Not on file    Inability: Not on file  . Transportation needs:    Medical: Not on file    Non-medical: Not on file  Tobacco Use  . Smoking status: Never Smoker  . Smokeless tobacco: Never Used  Substance and Sexual Activity  . Alcohol use: Yes    Frequency: Never    Comment: once per month socially  . Drug use: No  . Sexual activity: Yes    Birth control/protection: IUD, Condom    Comment: Mirena  Lifestyle  . Physical activity:    Days per week: Not on file    Minutes per session: Not on file  . Stress: Not on file  Relationships  . Social connections:    Talks on phone: Not on file    Gets together: Not on file    Attends religious service: Not on file    Active member of club or organization: Not on file    Attends meetings of clubs or organizations: Not on file    Relationship status: Not on file  .  Intimate partner violence:    Fear of current or ex partner: Not on file    Emotionally abused: Not on file    Physically abused: Not on file    Forced sexual activity: Not on file  Other Topics Concern  . 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 APPENDECTOMY  11/2015    Family History  Problem Relation Age of Onset  . Hypercholesterolemia Mother   . Hypertension Father   . Hypercholesterolemia Maternal Grandmother   . Other Maternal Grandfather        pulmonary valve replacement  . Diabetes  Paternal Grandmother     Allergies  Allergen Reactions  . Amoxicillin     Current Medications:   Current Outpatient Medications:  .  levonorgestrel (MIRENA, 52 MG,) 20 MCG/24HR IUD, 1 each by Intrauterine route once., Disp: , Rfl:  .  citalopram (CELEXA) 10 MG tablet, Take 1 tablet (10 mg total) by mouth daily., Disp: 90 tablet, Rfl: 0 .  fluconazole (DIFLUCAN) 150 MG tablet, Take 1 tablet (150 mg total) by mouth once for 1 dose., Disp: 1 tablet, Rfl: 3 .  nitrofurantoin, macrocrystal-monohydrate, (MACROBID) 100 MG capsule, Take 1 capsule (100 mg total) by mouth 2 (two) times daily., Disp: 10 capsule, Rfl: 0  Current Facility-Administered Medications:  .  0.9 %  sodium chloride infusion, 500 mL, Intravenous, Once, Armbruster, Willaim Rayas, MD   Review of Systems:   Review of Systems  Gastrointestinal: Positive for abdominal pain and diarrhea.  Genitourinary: Positive for dysuria (skin irritation) and vaginal discharge.  Musculoskeletal: Positive for back pain (Upper right).    Vitals:   Vitals:   06/09/18 0838  BP: 126/80  Pulse: 65  Temp: 98.8 F (37.1 C)  TempSrc: Oral  SpO2: 99%  Weight: 185 lb 4 oz (84 kg)  Height: 5' 5.25" (1.657 m)     Body mass index is 30.59 kg/m.  Physical Exam:   Physical Exam  Constitutional: She appears [name]-developed. She is cooperative.  Non-toxic appearance. She does not have a sickly appearance. She does not appear ill. No distress.  Cardiovascular: Normal rate, regular rhythm, S1 normal, S2 normal, normal heart sounds and normal pulses.  No LE edema  Pulmonary/Chest: Effort normal and breath sounds normal.  Abdominal: Normal appearance and bowel sounds are normal. There is no tenderness. There is no CVA tenderness.  Genitourinary: Rectum normal. There is no rash, tenderness or lesion on the right labia. There is no rash, tenderness or lesion on the left labia. Cervix exhibits no motion tenderness and no discharge. There is tenderness in  the vagina. Vaginal discharge found.  Genitourinary Comments: IUD strings present. Tender with speculum exam.  Neurological: She is alert. GCS eye subscore is 4. GCS verbal subscore is 5. GCS motor subscore is 6.  Skin: Skin is warm, dry and intact.  Psychiatric: She has a normal mood and affect. Her speech is normal and behavior is normal.  Nursing note and vitals reviewed.  Results for orders placed or performed in visit on 06/09/18  POCT urine pregnancy  Result Value Ref Range   Preg Test, Ur Negative Negative  POCT urinalysis dipstick  Result Value Ref Range   Color, UA Yellow    Clarity, UA Cloud    Glucose, UA Negative Negative   Bilirubin, UA Negative  Ketones, UA Negative    Spec Grav, UA 1.010 1.010 - 1.025   Blood, UA Moderate    pH, UA 8.0 5.0 - 8.0   Protein, UA Positive (A) Negative   Urobilinogen, UA 0.2 0.2 or 1.0 E.U./dL   Nitrite, UA Negative    Leukocytes, UA Large (3+) (A) Negative   Appearance     Odor       Assessment and Plan:    Helen [name] was seen today for vaginal discharge.  Diagnoses and all orders for this visit:  Chronic vaginitis UA concerning for possible infection -- start macrobid and await culture. Vaginal discharge appears to be possible yeast infection, start Diflucan tablet today. Will refer to Gertie Exon given history of chronic discharge and pelvic pain for further work-up. I have also suggested she try "RePhresh" gel.  Further treatment if needed based on cervical swab results. Declined STD testing. -     POCT urine pregnancy -     POCT urinalysis dipstick -     Ambulatory referral to Obstetrics / Gynecology -     Cervicovaginal ancillary only; Future  Need for prophylactic vaccination with combined diphtheria-tetanus-pertussis (DTP) vaccine -     Tdap vaccine greater than or equal to 7yo IM  Anxiety Improving. Decrease to Celexa 10 mg daily per patient request. Follow-up in 6 months, sooner if needed.  Other orders -      citalopram (CELEXA) 10 MG tablet; Take 1 tablet (10 mg total) by mouth daily. -     fluconazole (DIFLUCAN) 150 MG tablet; Take 1 tablet (150 mg total) by mouth once for 1 dose.    . Reviewed expectations re: course of current medical issues. . Discussed self-management of symptoms. . Outlined signs and symptoms indicating need for more acute intervention. . Patient verbalized understanding and all questions were answered. . See orders for this visit as documented in the electronic medical record. . Patient received an After-Visit Summary.  CMA or LPN served as scribe during this visit. History, Physical, and Plan performed by medical provider. The above documentation has been reviewed and is accurate and complete.   Jarold Motto, PA-C

## 2018-06-11 LAB — URINE CULTURE
MICRO NUMBER:: 91132463
SPECIMEN QUALITY:: ADEQUATE

## 2018-06-12 LAB — CERVICOVAGINAL ANCILLARY ONLY
Bacterial vaginitis: NEGATIVE
Candida vaginitis: NEGATIVE

## 2018-06-26 ENCOUNTER — Ambulatory Visit: Payer: No Typology Code available for payment source | Admitting: Obstetrics and Gynecology

## 2018-06-26 ENCOUNTER — Telehealth: Payer: Self-pay | Admitting: Obstetrics and Gynecology

## 2018-06-26 NOTE — Telephone Encounter (Signed)
Called and left a message for patient to call back to schedule a new patient doctor referral appointment with our office to see Dr. Oscar La for: Chronic vaginitis.

## 2018-06-26 NOTE — Telephone Encounter (Signed)
Left voicemail message for patient to return call regarding cancelled appointment because Dr Oscar La is delayed in surgery.

## 2018-07-14 ENCOUNTER — Ambulatory Visit (INDEPENDENT_AMBULATORY_CARE_PROVIDER_SITE_OTHER): Payer: No Typology Code available for payment source | Admitting: Obstetrics and Gynecology

## 2018-07-14 ENCOUNTER — Encounter: Payer: Self-pay | Admitting: Obstetrics and Gynecology

## 2018-07-14 ENCOUNTER — Other Ambulatory Visit (HOSPITAL_COMMUNITY)
Admission: RE | Admit: 2018-07-14 | Discharge: 2018-07-14 | Disposition: A | Discharge status: Home / Self Care | Payer: No Typology Code available for payment source | Source: Ambulatory Visit | Attending: Obstetrics and Gynecology | Admitting: Obstetrics and Gynecology

## 2018-07-14 ENCOUNTER — Other Ambulatory Visit: Payer: Self-pay

## 2018-07-14 VITALS — BP 138/72 | HR 57 | Ht 65.0 in | Wt 189.0 lb

## 2018-07-14 DIAGNOSIS — R5383 Other fatigue: Secondary | ICD-10-CM

## 2018-07-14 DIAGNOSIS — N3941 Urge incontinence: Secondary | ICD-10-CM

## 2018-07-14 DIAGNOSIS — Z113 Encounter for screening for infections with a predominantly sexual mode of transmission: Secondary | ICD-10-CM

## 2018-07-14 DIAGNOSIS — R195 Other fecal abnormalities: Secondary | ICD-10-CM

## 2018-07-14 DIAGNOSIS — R110 Nausea: Secondary | ICD-10-CM | POA: Diagnosis not present

## 2018-07-14 DIAGNOSIS — R635 Abnormal weight gain: Secondary | ICD-10-CM

## 2018-07-14 DIAGNOSIS — R1084 Generalized abdominal pain: Secondary | ICD-10-CM | POA: Diagnosis not present

## 2018-07-14 DIAGNOSIS — Z124 Encounter for screening for malignant neoplasm of cervix: Secondary | ICD-10-CM | POA: Diagnosis present

## 2018-07-14 DIAGNOSIS — N760 Acute vaginitis: Secondary | ICD-10-CM

## 2018-07-14 DIAGNOSIS — Z01419 Encounter for gynecological examination (general) (routine) without abnormal findings: Secondary | ICD-10-CM | POA: Diagnosis not present

## 2018-07-14 NOTE — Progress Notes (Signed)
27 y.o. G0P0000 Single White or Caucasian Not Hispanic or Latino female here for annual exam.  She has a mirena IUD, placed in May, 15. Still having monthly cycles, have gotten heavier and crampier. She changes a regular tampon in 4 hours (heavier prior to the IUD).  Period Cycle (Days): 28 Period Duration (Days): 5-7  Period Pattern: Regular Menstrual Flow: Moderate(1st couple days are heavy) Dysmenorrhea: (!) Moderate Dysmenorrhea Symptoms: Cramping, Headache, Diarrhea  She had a bad UTI in May, treated with antibiotics. Since then she has had recurrent UTI's and yeast infections. No current symptoms of yeast, but worried about recurrent infections.  UTI's do seem to correlate with intercourse.  She started having upper abdominal pain this summer, evaluated by her primary. She had an endoscopy, CT, ultrasound, everything has been normal. Having 3 loose BM's a day. Whole abdomen is tender. Hurts to push in her upper abdomen and in her lower abdomen. Every time she eats she gets pain in her RUQ and RLQ, sometimes in her LLQ. C/O frequent nausea. She has trouble with fatty foods and dairy. Eating mainly toast and eggs. Feels bloating.  She voids all the time, has urgency. Leaking some on the way to bathroom.  Negative UPT in June, negative mono testing She has felt lethargic, weight gain.  Sexually active, same partner for over a year. Not regularly using condoms. Occasional entry dyspareunia. Lubrication helps.   No LMP recorded. (Menstrual status: IUD).          Sexually active: Yes.    The current method of family planning is IUD.    Exercising: Yes.    Home exercise routine includes walking. Smoker:  no  Health Maintenance: Pap:  2017 per patient History of abnormal Pap:  no MMG:  never BMD:   never Colonoscopy: never TDaP:  2019 Gardasil: yes per patient   reports that she has never smoked. She has never used smokeless tobacco. She reports that she drinks alcohol. She reports that  she does not use drugs. Getting her masters in [employer] at [employer], done in May. She and her boyfriend are serious   Past Medical History:  Diagnosis Date  . Anxiety   . History of chicken pox   . History of shingles 01/2017    Past Surgical History:  Procedure Laterality Date  . APPENDECTOMY  11/2015    Current Outpatient Medications  Medication Sig Dispense Refill  . citalopram (CELEXA) 10 MG tablet Take 1 tablet (10 mg total) by mouth daily. 90 tablet 0  . levonorgestrel (MIRENA, 52 MG,) 20 MCG/24HR IUD 1 each by Intrauterine route once.     Current Facility-Administered Medications  Medication Dose Route Frequency Provider Last Rate Last Dose  . 0.9 %  sodium chloride infusion  500 mL Intravenous Once Armbruster, Willaim Rayas, MD        Family History  Problem Relation Age of Onset  . Hypercholesterolemia Mother   . Hypertension Father   . Hypercholesterolemia Maternal Grandmother   . Other Maternal Grandfather        pulmonary valve replacement  . Diabetes Paternal Grandmother     Review of Systems  Constitutional: Negative.   HENT: Negative.   Eyes: Negative.   Respiratory: Negative.   Cardiovascular: Negative.   Gastrointestinal: Positive for abdominal pain.  Endocrine: Negative.   Genitourinary: Negative.   Musculoskeletal: Negative.   Skin: Negative.   Allergic/Immunologic: Negative.   Neurological: Negative.   Hematological: Negative.   Psychiatric/Behavioral: Negative.  All other systems reviewed and are negative.   Exam:   BP 138/72   Pulse (!) 57   Ht 5\' 5"  (1.651 m)   Wt 189 lb (85.7 kg)   SpO2 99%   BMI 31.45 kg/m   Weight change: @WEIGHTCHANGE @ Height:   Height: 5\' 5"  (165.1 cm)  Ht Readings from Last 3 Encounters:  07/14/18 5\' 5"  (1.651 m)  06/09/18 5' 5.25" (1.657 m)  03/27/18 5' 5.25" (1.657 m)    General appearance: alert, cooperative and appears stated age Head: Normocephalic, without obvious abnormality, atraumatic Neck: no  adenopathy, supple, symmetrical, trachea midline and thyroid normal to inspection and palpation Lungs: clear to auscultation bilaterally Cardiovascular: regular rate and rhythm Breasts: normal appearance, no masses or tenderness Abdomen: soft, diffuse, mild tenderness; mildly distended,  no masses,  no organomegaly. Not tender with abdominal palpation when tensing her abdomen.  Extremities: extremities normal, atraumatic, no cyanosis or edema Skin: Skin color, texture, turgor normal. No rashes or lesions Lymph nodes: Cervical, supraclavicular, and axillary nodes normal. No abnormal inguinal nodes palpated Neurologic: Grossly normal   Pelvic: External genitalia:  no lesions              Urethra:  normal appearing urethra with no masses, tenderness or lesions              Bartholins and Skenes: normal                 Vagina: normal appearing vagina with normal color and discharge, no lesions              Cervix: no cervical motion tenderness, no lesions and IUD string 3-4 cm               Bimanual Exam:  Uterus:  normal size, contour, position, consistency, mobility, non-tender and anteverted              Adnexa: no mass, fullness, tenderness               Rectovaginal: Confirms               Anus:  normal sphincter tone, no lesions  Chaperone was present for exam.  A:  Well Woman with normal exam  C/O recurrent vaginitis  C/O recurrent UTI, seems related to sex  Abdominal pain, worsened with eating, distention, loose stool and nausea. Negative GI evaluation  Fatigue, weight gain, some diffuse aches and pains  Urge incontinence  P:   Pap with GC/CT  Affirm sent  Return with recurrent UTI, consider antibiotic suppression with intercourse  Recommended she try cutting out gluten from her diet  Recommended that she f/u with GI and her Primary for further evaluation of her multiple c/o (GI and systemic)  TSH  Urine for ua, c&s  IUD is due for removal in May, she may do this in [place]  this winter when she is home for break

## 2018-07-15 LAB — URINALYSIS, MICROSCOPIC ONLY
Casts: NONE SEEN /lpf
WBC, UA: >30 /hpf — AB (ref 0–5)

## 2018-07-15 LAB — VAGINITIS/VAGINOSIS, DNA PROBE
CANDIDA SPECIES: NEGATIVE
GARDNERELLA VAGINALIS: NEGATIVE
Trichomonas vaginosis: NEGATIVE

## 2018-07-15 LAB — TSH: TSH: 2.03 u[IU]/mL (ref 0.450–4.500)

## 2018-07-16 LAB — URINE CULTURE

## 2018-07-17 ENCOUNTER — Telehealth: Payer: Self-pay | Admitting: Emergency Medicine

## 2018-07-17 ENCOUNTER — Encounter: Payer: Self-pay | Admitting: Emergency Medicine

## 2018-07-17 MED ORDER — SULFAMETHOXAZOLE-TRIMETHOPRIM 800-160 MG PO TABS: 1.0000 | ORAL_TABLET | Freq: Two times a day (BID) | ORAL | 0 refills | Status: AC

## 2018-07-17 MED ORDER — NITROFURANTOIN MACROCRYSTAL 50 MG PO CAPS: 50.0000 mg | ORAL_CAPSULE | ORAL | 1 refills | Status: AC | PRN

## 2018-07-17 NOTE — Telephone Encounter (Signed)
-----   Message from Romualdo Bolk, MD sent at 07/17/2018 11:15 AM EDT ----- Please call the patient and let her know that she does have a UTI and treat with Bactrim DS, 1 po BID x 3 days, #6, no refills. She has been having recurrent UTI's with sex. Pleas also call in macrodantin 50 mg po prn intercourse, #30,  1 refill.  Her vaginitis panel was negative and her TSH was normal.  Pap is pending.

## 2018-07-17 NOTE — Telephone Encounter (Signed)
Call to patient.  Mailbox full. Unable to leave message.  Rx sent.  Mychart message sent and requested patient return call to office.

## 2018-07-18 LAB — CYTOLOGY - PAP
Chlamydia: NEGATIVE
Diagnosis: NEGATIVE
Neisseria Gonorrhea: NEGATIVE

## 2018-07-21 ENCOUNTER — Ambulatory Visit (INDEPENDENT_AMBULATORY_CARE_PROVIDER_SITE_OTHER): Payer: No Typology Code available for payment source | Admitting: Physician Assistant

## 2018-07-21 ENCOUNTER — Encounter: Payer: Self-pay | Admitting: Physician Assistant

## 2018-07-21 ENCOUNTER — Telehealth: Payer: Self-pay | Admitting: Physician Assistant

## 2018-07-21 VITALS — BP 118/78 | HR 58 | Ht 65.0 in | Wt 189.6 lb

## 2018-07-21 DIAGNOSIS — R110 Nausea: Secondary | ICD-10-CM

## 2018-07-21 DIAGNOSIS — R1011 Right upper quadrant pain: Secondary | ICD-10-CM | POA: Diagnosis not present

## 2018-07-21 DIAGNOSIS — N390 Urinary tract infection, site not specified: Secondary | ICD-10-CM | POA: Diagnosis not present

## 2018-07-21 DIAGNOSIS — R1013 Epigastric pain: Secondary | ICD-10-CM | POA: Diagnosis not present

## 2018-07-21 MED ORDER — DICYCLOMINE HCL 10 MG PO CAPS: ORAL_CAPSULE | ORAL | 2 refills | Status: AC

## 2018-07-21 NOTE — Telephone Encounter (Signed)
Spoke with patient. She has completed Bactrim. Feels "okay." Has Macrobid for UTI prophylaxis. Saw GI and has pending appointment with Urology.  Will call back if any questions or returning symptoms.  Encounter closed.

## 2018-07-21 NOTE — Telephone Encounter (Signed)
Fast busy #.

## 2018-07-21 NOTE — Progress Notes (Signed)
Agree with assessment with the following thoughts. EGD, CT, [place] normal. HIDA is normal, she does not have biliary dyskinesia, her GB EF is normal. I don't think she would warrant cholecystectomy based on workup thus far. 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 I would consider a trial of buspirone for that if she hasn't tried that yet and her symptoms persist otherwise despite Bentyl. Thanks

## 2018-07-21 NOTE — Progress Notes (Signed)
Chief Complaint: Follow-up abdominal pain and nausea  HPI:    Helen [name] is a 27 year old Caucasian female with a past medical history as listed below, assigned to Dr. Adela Lank at time of last visit, who returns to clinic today for follow-up of abdominal pain and nausea.    03/09/2018 patient seen in clinic and explains that for the past 2 months she had felt severely fatigued.  She had been diagnosed with a UTI as well as a yeast infection recently.  Also described some right upper quadrant pain rated as a 6-7/10 which sometimes woke her up from sleep.  This was associated with nausea and occasional vomiting.  Patient had a recent HIDA scan with [facility] with a normal ejection fraction but did experience abdominal pain with oral Ensure consumption on 03/01/2018.  Recent CT and labs had been normal.  Patient was tested for mono and scheduled for an EGD as well as prescribed Omeprazole 40 mg daily.  It was discussed that if labs and EGD were normal she could be referred to [facility] for biliary dyskinesia.    03/27/2018 EGD was completely normal.  Biopsies were normal and negative for H. pylori.  At that time recommend patient increase Omeprazole to 40 mg twice daily as she had seen some improvement with once daily dosing.  It was discussed that if it helped and then she should continue it for another month and then taper back to once daily.    Today, the patient explains that she has had really no change in her symptoms.  Tells me that she used the Omeprazole just once a day and finished the entire course but never felt any better.  Explains that she continues with some epigastric/right upper quadrant pain which is made worse by certain foods including dairy and others which seem to upset her stomach.  Also some generalized abdominal cramping, especially before a bowel movement which is somewhat better afterwards, has 3 solid bowel movements a day which are "normal", but during bowel movement she has so much  cramping she has to "bend over".  Continues with nausea but no vomiting.  Patient tells me she is "gaining weight even though I have a decrease in appetite".    Also tells me she has had at least 4-5 UTIs and a couple yeast infections since May of this year.  She is discouraged that she has seen four separate physicians which have diagnosed her with these and "no one is figuring out what is going on". Tells me the WBC count on her U/A's keeps increasing with each UTI.    Social history positive for being in grad school.    Denies fever, chills, weight loss, anorexia, blood in her stool or symptoms that awaken her from sleep.     Past Medical History:  Diagnosis Date  . Anxiety   . History of chicken pox   . History of shingles 01/2017    Past Surgical History:  Procedure Laterality Date  . APPENDECTOMY  11/2015    Current Outpatient Medications  Medication Sig Dispense Refill  . citalopram (CELEXA) 10 MG tablet Take 1 tablet (10 mg total) by mouth daily. 90 tablet 0  . levonorgestrel (MIRENA, 52 MG,) 20 MCG/24HR IUD 1 each by Intrauterine route once.    . nitrofurantoin (MACRODANTIN) 50 MG capsule Take 1 capsule (50 mg total) by mouth as needed (post coidal UTI prophylaxis). 30 capsule 1   Current Facility-Administered Medications  Medication Dose Route Frequency Provider Last Rate  Last Dose  . 0.9 %  sodium chloride infusion  500 mL Intravenous Once Armbruster, Willaim Rayas, MD        Allergies as of 07/21/2018 - Review Complete 07/17/2018  Allergen Reaction Noted  . Amoxicillin  02/14/2018    Family History  Problem Relation Age of Onset  . Hypercholesterolemia Mother   . Hypertension Father   . Hypercholesterolemia Maternal Grandmother   . Other Maternal Grandfather        pulmonary valve replacement  . Diabetes Paternal Grandmother     Social History   Socioeconomic History  . Marital status: Single    Spouse name: Not on file  . Number of children: Not on file  .  Years of education: Not on file  . Highest education level: Not on file  Occupational History  . Not on file  Social Needs  . Financial resource strain: Not on file  . Food insecurity:    Worry: Not on file    Inability: Not on file  . Transportation needs:    Medical: Not on file    Non-medical: Not on file  Tobacco Use  . Smoking status: Never Smoker  . Smokeless tobacco: Never Used  Substance and Sexual Activity  . Alcohol use: Yes    Frequency: Never    Comment: once per month socially  . Drug use: No  . Sexual activity: Yes    Birth control/protection: IUD, Condom    Comment: Mirena  Lifestyle  . Physical activity:    Days per week: Not on file    Minutes per session: Not on file  . Stress: Not on file  Relationships  . Social connections:    Talks on phone: Not on file    Gets together: Not on file    Attends religious service: Not on file    Active member of club or organization: Not on file    Attends meetings of clubs or organizations: Not on file    Relationship status: Not on file  . Intimate partner violence:    Fear of current or ex partner: Not on file    Emotionally abused: Not on file    Physically abused: Not on file    Forced sexual activity: Not on file  Other Topics Concern  . 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 Lungs clear to auscultation bilaterally.   No wheezes, crackles, or rhonchi.    Cardiovascular: Normal S1, S2. No MRG. Regular rate and rhythm. No peripheral edema, cyanosis or pallor.  Gastrointestinal:  Soft, nondistended, mild RLQ ttp and Epigastric ttp. No rebound or guarding. Normal bowel sounds. No appreciable masses or hepatomegaly. Psychiatric: Demonstrates good judgement and reason without abnormal affect or behaviors.  No recent labs or imaging.  Assessment: 1.  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  Nausea: Continues per patient, recent EGD, CT, ultrasound normal.  HIDA scan did show some biliary dyskinesia, question relation to this versus recurrent UTIs 3.  Recurrent UTIs: 4-5 over the past 6 months per patient, also chronic abdominal pain  Plan: 1.  Referred patient to urology for recurrent UTIs, 4-5 over this past year per patient, also some chronic abdominal pain related to this. 2.  Discussed with patient that she should let me know how the appointment above goes.  Pending their thoughts in regards to her chronic abdominal pain and symptoms could then consider referral to [facility] for cholecystectomy due to biliary dyskinesia. 3.  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  This is likely due to her anxiety over her current health situation and stress of grad school. 4.  Prescribed Dicyclomine 10 mg 3 times daily, 20-30 minutes before meals and at bedtime #90 with 3 refills 5.  Patient to follow in clinic with me as directed after time of urology appointment.  Helen Meeker, PA-C [facility] 07/21/2018, 9:17 AM  Cc: Jarold Motto, [place]

## 2018-07-21 NOTE — Patient Instructions (Addendum)
Normal BMI (Body Mass Index- based on height and weight) is between 19 and 25. Your BMI today is Body mass index is 31.55 kg/m. Marland Kitchen Please consider follow up  regarding your BMI with your Primary Care Provider.  We sent a prescription for Dicyclomine ( Bentyl) 10 mg to your pharmacy. [facility] [address]. We will be calling you with an appointment at [facility], [address], phone is [phone].   Contact us in My Chart and let Hyacinth Meeker PA know how the Urology appointment goes,

## 2018-07-24 NOTE — Telephone Encounter (Signed)
The pt will contact her insurance and see who she can see and we can make that referral.

## 2018-07-24 NOTE — Telephone Encounter (Signed)
Left message on machine to call back  

## 2018-07-27 ENCOUNTER — Encounter: Payer: Self-pay | Admitting: *Deleted

## 2018-07-28 ENCOUNTER — Encounter: Payer: Self-pay | Admitting: Diagnostic Neuroimaging

## 2018-07-28 ENCOUNTER — Ambulatory Visit (INDEPENDENT_AMBULATORY_CARE_PROVIDER_SITE_OTHER): Payer: No Typology Code available for payment source | Admitting: Diagnostic Neuroimaging

## 2018-07-28 VITALS — BP 115/66 | HR 63 | Ht 65.0 in | Wt 192.0 lb

## 2018-07-28 DIAGNOSIS — R269 Unspecified abnormalities of gait and mobility: Secondary | ICD-10-CM | POA: Diagnosis not present

## 2018-07-28 DIAGNOSIS — R413 Other amnesia: Secondary | ICD-10-CM

## 2018-07-28 NOTE — Patient Instructions (Signed)
-   check labs and MRI brain and cervical
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
## 2018-07-31 ENCOUNTER — Telehealth: Payer: Self-pay | Admitting: Diagnostic Neuroimaging

## 2018-07-31 NOTE — Telephone Encounter (Signed)
[facility] Multiplan auth: NPR spoke to [facility] Ref # [id number] order sent to GI pt is aware. She also has GI phone number of [phone] and if she has not heard anything in the next 2-3 business days.

## 2018-08-01 ENCOUNTER — Encounter: Payer: Self-pay | Admitting: *Deleted

## 2018-08-01 LAB — CBC WITH DIFFERENTIAL/PLATELET
Basophils Absolute: 0.1 10*3/uL (ref 0.0–0.2)
Basos: 1 %
EOS (ABSOLUTE): 0.2 10*3/uL (ref 0.0–0.4)
Eos: 2 %
Hematocrit: 38.3 % (ref 34.0–46.6)
Hemoglobin: 13.2 g/dL (ref 11.1–15.9)
IMMATURE GRANS (ABS): 0 10*3/uL (ref 0.0–0.1)
IMMATURE GRANULOCYTES: 1 %
LYMPHS: 31 %
Lymphocytes Absolute: 2.7 10*3/uL (ref 0.7–3.1)
MCH: 31.5 pg (ref 26.6–33.0)
MCHC: 34.5 g/dL (ref 31.5–35.7)
MCV: 91 fL (ref 79–97)
Monocytes Absolute: 0.6 10*3/uL (ref 0.1–0.9)
Monocytes: 6 %
NEUTROS PCT: 59 %
Neutrophils Absolute: 5.1 10*3/uL (ref 1.4–7.0)
PLATELETS: 291 10*3/uL (ref 150–450)
RBC: 4.19 x10E6/uL (ref 3.77–5.28)
RDW: 12.5 % (ref 12.3–15.4)
WBC: 8.6 10*3/uL (ref 3.4–10.8)

## 2018-08-01 LAB — ANA,IFA RA DIAG PNL W/RFLX TIT/PATN
ANA TITER 1: NEGATIVE
CYCLIC CITRULLIN PEPTIDE AB: 10 U (ref 0–19)
Rhuematoid fact SerPl-aCnc: <10 IU/mL (ref 0.0–13.9)

## 2018-08-01 LAB — COMPREHENSIVE METABOLIC PANEL
ALK PHOS: 87 IU/L (ref 39–117)
ALT: 19 IU/L (ref 0–32)
AST: 19 IU/L (ref 0–40)
Albumin/Globulin Ratio: 1.5 (ref 1.2–2.2)
Albumin: 4.3 g/dL (ref 3.5–5.5)
BUN / CREAT RATIO: 23 (ref 9–23)
BUN: 18 mg/dL (ref 6–20)
Bilirubin Total: 0.4 mg/dL (ref 0.0–1.2)
CO2: 21 mmol/L (ref 20–29)
CREATININE: 0.79 mg/dL (ref 0.57–1.00)
Calcium: 9.6 mg/dL (ref 8.7–10.2)
Chloride: 103 mmol/L (ref 96–106)
GFR calc Af Amer: 121 mL/min/{1.73_m2} (ref >59)
GFR calc non Af Amer: 105 mL/min/{1.73_m2} (ref >59)
GLUCOSE: 79 mg/dL (ref 65–99)
Globulin, Total: 2.8 g/dL (ref 1.5–4.5)
Potassium: 4.8 mmol/L (ref 3.5–5.2)
Sodium: 138 mmol/L (ref 134–144)
TOTAL PROTEIN: 7.1 g/dL (ref 6.0–8.5)

## 2018-08-01 LAB — VITAMIN B12: VITAMIN B 12: 944 pg/mL (ref 232–1245)

## 2018-08-01 NOTE — Progress Notes (Signed)
Sent pt mychart message about results.  

## 2018-08-01 NOTE — Progress Notes (Signed)
My chart message sent to pt stating normal labs.

## 2018-08-02 ENCOUNTER — Telehealth: Payer: Self-pay | Admitting: Physician Assistant

## 2018-08-02 NOTE — Telephone Encounter (Signed)
Rec'd from [facility] forwarded 3 pages to [facility] Jennifer PA

## 2018-08-10 ENCOUNTER — Ambulatory Visit
Admission: RE | Admit: 2018-08-10 | Discharge: 2018-08-10 | Disposition: A | Discharge status: Home / Self Care | Payer: No Typology Code available for payment source | Source: Ambulatory Visit | Attending: Diagnostic Neuroimaging | Admitting: Diagnostic Neuroimaging

## 2018-08-10 DIAGNOSIS — R269 Unspecified abnormalities of gait and mobility: Secondary | ICD-10-CM | POA: Diagnosis not present

## 2018-08-10 DIAGNOSIS — R413 Other amnesia: Secondary | ICD-10-CM | POA: Diagnosis not present

## 2018-08-10 MED ORDER — GADOBENATE DIMEGLUMINE 529 MG/ML IV SOLN
18.0000 mL | Freq: Once | INTRAVENOUS | Status: AC | PRN
  Administered 2018-08-10: 18 mL via INTRAVENOUS

## 2018-08-14 ENCOUNTER — Encounter: Payer: Self-pay | Admitting: *Deleted

## 2018-08-20 ENCOUNTER — Encounter: Payer: Self-pay | Admitting: Obstetrics and Gynecology

## 2018-08-21 ENCOUNTER — Other Ambulatory Visit: Payer: Self-pay

## 2018-08-21 ENCOUNTER — Ambulatory Visit (INDEPENDENT_AMBULATORY_CARE_PROVIDER_SITE_OTHER): Payer: No Typology Code available for payment source | Admitting: Obstetrics and Gynecology

## 2018-08-21 ENCOUNTER — Encounter: Payer: Self-pay | Admitting: Obstetrics and Gynecology

## 2018-08-21 ENCOUNTER — Telehealth: Payer: Self-pay | Admitting: Obstetrics and Gynecology

## 2018-08-21 VITALS — BP 132/78 | HR 80 | Temp 98.5°F

## 2018-08-21 DIAGNOSIS — R1030 Lower abdominal pain, unspecified: Secondary | ICD-10-CM | POA: Diagnosis not present

## 2018-08-21 DIAGNOSIS — M545 Low back pain, unspecified: Secondary | ICD-10-CM

## 2018-08-21 DIAGNOSIS — N949 Unspecified condition associated with female genital organs and menstrual cycle: Secondary | ICD-10-CM

## 2018-08-21 DIAGNOSIS — R300 Dysuria: Secondary | ICD-10-CM

## 2018-08-21 DIAGNOSIS — R350 Frequency of micturition: Secondary | ICD-10-CM | POA: Diagnosis not present

## 2018-08-21 DIAGNOSIS — N9489 Other specified conditions associated with female genital organs and menstrual cycle: Secondary | ICD-10-CM

## 2018-08-21 LAB — POCT URINALYSIS DIPSTICK
BILIRUBIN UA: NEGATIVE
Blood, UA: POSITIVE
GLUCOSE UA: NEGATIVE
Ketones, UA: NEGATIVE
Nitrite, UA: NEGATIVE
PH UA: 5 (ref 5.0–8.0)
Protein, UA: NEGATIVE
Spec Grav, UA: 1.01 (ref 1.010–1.025)
UROBILINOGEN UA: 0.2 U/dL

## 2018-08-21 MED ORDER — PHENAZOPYRIDINE HCL 200 MG PO TABS: 200.0000 mg | ORAL_TABLET | Freq: Three times a day (TID) | ORAL | 0 refills | Status: AC | PRN

## 2018-08-21 MED ORDER — SULFAMETHOXAZOLE-TRIMETHOPRIM 800-160 MG PO TABS: 1.0000 | ORAL_TABLET | Freq: Two times a day (BID) | ORAL | 0 refills | Status: AC

## 2018-08-21 NOTE — Progress Notes (Signed)
GYNECOLOGY  VISIT   HPI: 27 y.o.   Single White or Caucasian Not Hispanic or Latino  female   [id number] with Patient's last menstrual period was 08/03/2018 (exact date).   here for UTI symptoms with lower back pain. Reports having a fever of 102 last night. No fever today.   She had symptoms of a yeast infection last week, self treated with monistat. On Friday she started having abdominal pain and diarrhea, has been having diarrhea since then. Having 3 watery BM's a day.  She c/o intermittent pain in her lower abdomen, worse in the right lower quadrant. Hurts in her lower abdomen when she voids and with a BM. She has lower back pain. Yesterday she had a "hot flash", sweats, had diarrhea, burning with urination. She then got cold and hot.  Now she has a constant pain on her pubic bone and RLQ. She c/o anorexia, intermittent nausea, no emesis. Fever 102 yesterday, none today.  Sexually active, same partner. No vulvar itching, burning or irritation, no current vaginal d/c. Vaginal burning with sex. Worse when she goes to the bathroom.  She does c/o urinary frequency and urgency. H/O frequent UTI's on prophylactic antibiotics with UTI.   She has seen GI, negative evaluation for upper abdominal pain and nausea. States she saw a Insurance underwriter, had a bladder scan, he gave her Uribel for urinary frequency, it didn't help   GYNECOLOGIC HISTORY: Patient's last menstrual period was 08/03/2018 (exact date). Contraception: IUD Menopausal hormone therapy: None        OB History    Gravida  0   Para  0   Term  0   Preterm  0   AB  0   Living  0     SAB  0   TAB  0   Ectopic  0   Multiple  0   Live Births  0              Patient Active Problem List   Diagnosis Date Noted  . Anxiety 02/21/2018  . PTSD (post-traumatic stress disorder) 02/21/2018  . Pain in right ankle and joints of right foot 08/01/2017    Past Medical History:  Diagnosis Date  . Anxiety   . History of chicken pox    . History of shingles 01/2017    Past Surgical History:  Procedure Laterality Date  . APPENDECTOMY  11/2015    Current Outpatient Medications  Medication Sig Dispense Refill  . citalopram (CELEXA) 10 MG tablet Take 1 tablet (10 mg total) by mouth daily. 90 tablet 0  . levonorgestrel (MIRENA, 52 MG,) 20 MCG/24HR IUD 1 each by Intrauterine route once.    . nitrofurantoin (MACRODANTIN) 50 MG capsule Take 1 capsule (50 mg total) by mouth as needed (post coidal UTI prophylaxis). 30 capsule 1  . dicyclomine (BENTYL) 10 MG capsule Take 1 tablet by mouth 20-30 minutes before meals. (Patient not taking: Reported on 08/21/2018) 90 capsule 2   Current Facility-Administered Medications  Medication Dose Route Frequency Provider Last Rate Last Dose  . 0.9 %  sodium chloride infusion  500 mL Intravenous Once Armbruster, Willaim Rayas, MD         ALLERGIES: Amoxicillin  Family History  Problem Relation Age of Onset  . Hypercholesterolemia Mother   . Hypertension Mother   . Kidney Stones Mother   . Hypertension Father   . Hypercholesterolemia Maternal Grandmother   . Other Maternal Grandfather        pulmonary valve  replacement  . Diabetes Paternal Grandmother     Social History   Socioeconomic History  . Marital status: Single    Spouse name: Not on file  . Number of children: Not on file  . Years of education: Not on file  . Highest education level: Master's degree (e.g., MA, MS, MEng, MEd, MSW, MBA)  Occupational History  . Not on file  Social Needs  . Financial resource strain: Not on file  . Food insecurity:    Worry: Not on file    Inability: Not on file  . Transportation needs:    Medical: Not on file    Non-medical: Not on file  Tobacco Use  . Smoking status: Never Smoker  . Smokeless tobacco: Never Used  Substance and Sexual Activity  . Alcohol use: Yes    Frequency: Never    Comment: once per month socially  . Drug use: No  . Sexual activity: Yes    Birth  control/protection: IUD, Condom    Comment: Mirena  Lifestyle  . Physical activity:    Days per week: Not on file    Minutes per session: Not on file  . Stress: Not on file  Relationships  . Social connections:    Talks on phone: Not on file    Gets together: Not on file    Attends religious service: Not on file    Active member of club or organization: Not on file    Attends meetings of clubs or organizations: Not on file    Relationship status: Not on file  . Intimate partner violence:    Fear of current or ex partner: Not on file    Emotionally abused: Not on file    Physically abused: Not on file    Forced sexual activity: Not on file  Other Topics Concern  . 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  Education:  Last semester for [employer].    From [place]   Likes to workout and lift   Caffeine- 2 a day    Review of Systems  Constitutional: Positive for fever.  HENT: Positive for congestion and sinus pain.   Eyes: Negative.   Respiratory: Negative.   Cardiovascular: Negative.   Gastrointestinal: Positive for diarrhea.  Genitourinary: Positive for dysuria, flank pain, frequency, hematuria and urgency.       Discharge Vaginal itching  Skin: Negative.   Neurological: Positive for headaches.  Endo/Heme/Allergies:       Excessive thirst  Psychiatric/Behavioral: Negative.     PHYSICAL EXAMINATION:    BP 132/78 (BP Location: Right Arm, Patient Position: Sitting, Cuff Size: Normal)   Pulse 80   Temp 98.5 F (36.9 C)   LMP 08/03/2018 (Exact Date)     General appearance: alert, cooperative and appears stated age Abdomen: soft, tender in the mid lower abdomen and RLQ. No rebound or guarding, mildly distended, no masses,  no organomegaly CVA: not tender  Pelvic: External genitalia:  no lesions              Urethra:  normal appearing urethra with no masses, tenderness or lesions               Bartholins and Skenes: normal                 Vagina: normal appearing vagina with normal color and discharge, no lesions  Cervix: no cervical motion tenderness and no lesions              Bimanual Exam:  Uterus:  normal size, contour, position, consistency, mobility, non-tender              Adnexa: no masses, tender in the right adnexa               Chaperone was present for exam.  ASSESSMENT Urinary frequency, urgency, dysuria Vaginal burning, self treated for yeast Back pain, likely MS pain Abdominal pain and diarrhea, she has had an appendectomy, she doesn't have a surgical abdomen.     PLAN Send urine for ua, c&s Send affirm Genprobe Treat with bactrim and pyridium F/U with primary for Abdominal pain and diarrhea Get a copy of Urology notes   An After Visit Summary was printed and given to the patient.  ~25 minutes face to face time of which over 50% was spent in counseling.

## 2018-08-21 NOTE — Telephone Encounter (Signed)
Late entry for return call from patient  at [phone].  Reviewed mychart message.  UTI symptoms not improved with use of Macrobid.  In the care with family, travelling home from [place].  Feels chills/fever and flank pain. Some diarrhea.  No vomiting.  Office visit today with Dr. Oscar LaJertson.

## 2018-08-21 NOTE — Telephone Encounter (Signed)
Patient sent the following correspondence through MyChart. Routing to triage to assist patient with request and for further assessment and scheduling.  Hi Dr. Oscar LaJertson,    I believe I am having another UTI, though I have been taking the nitrofurintoin that was prescribed as suggested, which is after sex and as needed. I've also had two more yeast infections since our last visit. I was wondering if I need to schedule another appointment or what I can do to feel better? This time I've been running a fever, diarrhea and my low back has been hurting extensively.     Thank you!   Helen [name]

## 2018-08-21 NOTE — Telephone Encounter (Signed)
Message left to return call to Peerlessracy at [phone].   Needs Office visit with Dr. Oscar LaJertson.

## 2018-08-22 LAB — URINALYSIS, MICROSCOPIC ONLY: Casts: NONE SEEN /lpf

## 2018-08-22 LAB — VAGINITIS/VAGINOSIS, DNA PROBE
CANDIDA SPECIES: NEGATIVE
GARDNERELLA VAGINALIS: POSITIVE — AB
Trichomonas vaginosis: NEGATIVE

## 2018-08-22 LAB — URINE CULTURE

## 2018-08-22 LAB — GC/CHLAMYDIA PROBE AMP
CHLAMYDIA, DNA PROBE: NEGATIVE
NEISSERIA GONORRHOEAE BY PCR: NEGATIVE

## 2018-08-23 ENCOUNTER — Other Ambulatory Visit: Payer: Self-pay | Admitting: *Deleted

## 2018-08-23 MED ORDER — METRONIDAZOLE 500 MG PO TABS: 500.0000 mg | ORAL_TABLET | Freq: Two times a day (BID) | ORAL | 0 refills | Status: AC

## 2018-09-08 ENCOUNTER — Encounter: Payer: Self-pay | Admitting: Obstetrics and Gynecology

## 2018-09-08 ENCOUNTER — Telehealth: Payer: Self-pay | Admitting: Obstetrics and Gynecology

## 2018-09-08 NOTE — Telephone Encounter (Signed)
Patient sent the following correspondence through MyChart. Routing to triage to assist patient with request.  Hi Dr. Oscar LaJertson,    I hope all is going well and happy holidays! I finished up with the Flagil about a week ago, and I'm experiencing symptoms of another yeast infection and UTI. What do you recommend?    Thanks,  [name]

## 2018-09-08 NOTE — Telephone Encounter (Signed)
Mychart message sent to patient:  Helen [name],  I just tried to reach you on your mobile phone. One of the nurses will need to speak with you regarding your symptoms so that we can better understand what is occurring for you. It sounds like you may need to be evaluated and have an appointment.Can you please call our office at your earliest convenience?  Thank you, Almedia Ballsracy Shynia Daleo, RN

## 2018-09-08 NOTE — Telephone Encounter (Signed)
Patient returning call.

## 2018-09-08 NOTE — Telephone Encounter (Signed)
Spoke with Helen [name]. Helen [name] reports white, clumpy vaginal d/c, vaginal itching, cloudy urine, urinary frequency and urgency. Has chronic lower back pain. Denies fever/chills. Currently in [place], [place]. Treated for BV 12/4 and UTI symptoms on 12/2. Helen [name] is scheduled to see her PCP later today. Helen [name] states she was advised to let Dr. Oscar LaJertson know if symptoms reoccurred.  Recommended Helen [name] keep OV as scheudled with PCP. Return call to schedule f/u with Dr. Oscar LaJertson if needed. Will update provider and return call if any additional recommendations.    Routing to covering provider for final review. Helen [name] is agreeable to disposition. Will close encounter.  Cc: Dr. Oscar LaJertson

## 2018-09-08 NOTE — Telephone Encounter (Signed)
Message left to return call to Babson Parkracy at [phone].   Will also send mychart message in response.  Patient possibly in [place]?

## 2018-09-11 ENCOUNTER — Telehealth: Payer: Self-pay | Admitting: *Deleted

## 2018-09-11 NOTE — Telephone Encounter (Signed)
Please check what happened at her primary, are her symptoms better. If she is having any residual issues please schedule her for a visit on Thursday

## 2018-09-11 NOTE — Telephone Encounter (Signed)
Encounter previously closed, see telephone encounter dated 09/11/18.

## 2018-09-11 NOTE — Telephone Encounter (Signed)
Left message to call Noreene LarssonJill, RN at [facility] [phone].   See telephone encounter dated 09/08/18.     1:08 PM  Romualdo BolkJertson, Fantasia Jinkins Evelyn, MD routed this conversation to Me  Romualdo BolkJertson, Kaydynce Pat Evelyn, MD     1:08 PM  Note    Please check what happened at her primary, are her symptoms better. If she is having any residual issues please schedule her for a visit on Thursday

## 2018-09-18 NOTE — Telephone Encounter (Signed)
Spoke with patient. Patient states UA was negative, symptoms have resolved. Had gallbladder removed on 09/14/18. Patient reports she is still in [place], will return to [place] next week after 1/6. Patient states surgeon advised her she had 4 cyst on ovaries, with blood present that had drained from cyst. Patient states she will request copy of report be sent to Dr. Oscar LaJertson.   Advised I will provide update to Dr. Oscar LaJertson and return call with recommendations. Patient agreeable.    Dr. Oscar LaJertson -please review.

## 2018-09-19 NOTE — Telephone Encounter (Signed)
Left detailed message for patient with message from Dr.Jertson. Advised to return call with any additional questions. Encounter closed.

## 2018-09-19 NOTE — Telephone Encounter (Signed)
Will await reports, if she hasn't heard from us in a couple of weeks, she should call back

## 2019-01-05 ENCOUNTER — Telehealth: Payer: Self-pay | Admitting: *Deleted

## 2019-01-05 NOTE — Telephone Encounter (Signed)
Left message on voicemail to call office. Needs follow up for anxiety with Samantha.

## 2019-01-10 NOTE — Telephone Encounter (Signed)
Left message on voicemail to call office.  

## 2019-01-12 NOTE — Telephone Encounter (Signed)
Pt states she has moved to [place] and is doing great.  She is set to get married August, and is feeling really good and does not feel she needs appt. She did ask however, if Lelon Mast knows of any doctors in [place] she could recommend.

## 2019-01-17 NOTE — Telephone Encounter (Signed)
Please see message. °

## 2019-01-18 ENCOUNTER — Encounter: Payer: Self-pay | Admitting: *Deleted

## 2019-01-18 NOTE — Telephone Encounter (Signed)
Mychart sent to pt.

## 2019-01-18 NOTE — Telephone Encounter (Signed)
I'm glad to hear she is doing well. I currently don't know any family medicine providers in [place].

## 2019-08-22 IMAGING — NM NM HEPATO W/GB/PHARM/[PERSON_NAME]
1 series · 12 of 12 positions shown · non-contrast
Comparison: None.

CLINICAL DATA: Upper abdominal pain with nausea and vomiting

EXAM:
NUCLEAR MEDICINE HEPATOBILIARY IMAGING WITH GALLBLADDER EF
VIEWS:
Anterior right upper quadrant
RADIOPHARMACEUTICALS:  5.5 mCi 2c-OOm  Choletec IV

[Series 1: hepato · 4.46mm/px · 2 acquisitions, 12 frames shown]
[im 1/2]
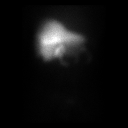
[im 1/2]
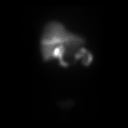
[im 1/2]
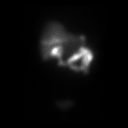
[im 1/2]
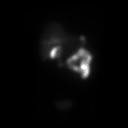
[im 1/2]
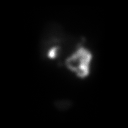
[im 1/2]
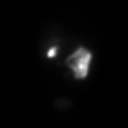
[im 2/2]
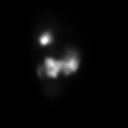
[im 2/2]
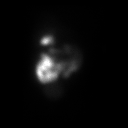
[im 2/2]
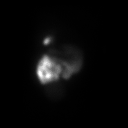
[im 2/2]
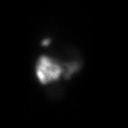
[im 2/2]
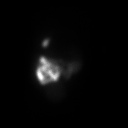
[im 2/2]
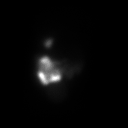

[12 of 12 positions shown; findings below may reference images not displayed]

FINDINGS: Liver uptake of radiotracer is normal. There is prompt visualization
of gallbladder and small bowel, indicating patency of the cystic and
common bile ducts. Patient consumed 8 ounces of Ensure orally with
calculation of the computer generated ejection fraction of
radiotracer from the gallbladder. The patient did experience
abdominal pain with the oral Ensure consumption. The computer
generated ejection fraction of radiotracer from the gallbladder is
normal at 73.1%, normal greater than 33% using the oral agent.
IMPRESSION: Normal ejection fraction of radiotracer from the gallbladder. The
patient did experience abdominal pain with the oral Ensure
consumption. Cystic and common bile ducts are patent as is evidenced
by visualization of gallbladder and small bowel.

## 2020-03-21 IMAGING — US US ABDOMEN COMPLETE
1 series · 14 of 25 positions shown · non-contrast
Comparison: CT of the abdomen and pelvis on 02/21/2018

CLINICAL DATA: Bilateral flank pain. Nausea and UPPER abdominal
pain. Fatigue. Intermittent sharp abdominal pain. Symptoms for 6
weeks.

EXAM:
ABDOMEN ULTRASOUND COMPLETE

[Series 1: us abdomen complete · 0.29mm/px · 14 of 102 slices shown]
[im 1/102]
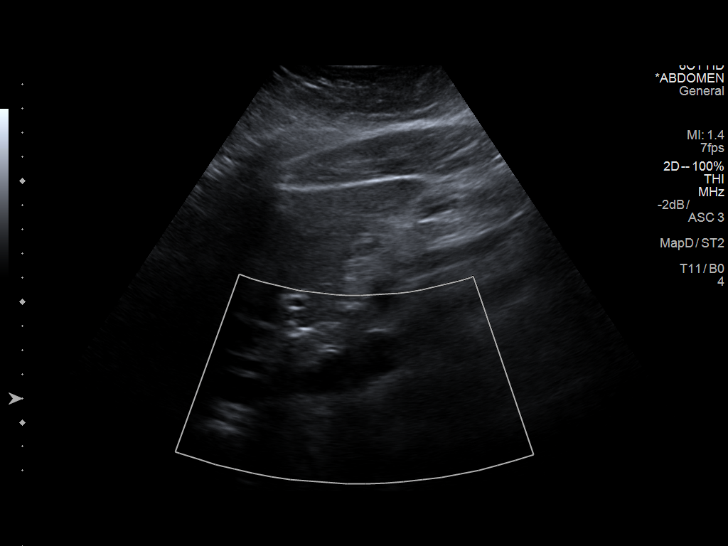
[im 9/102]
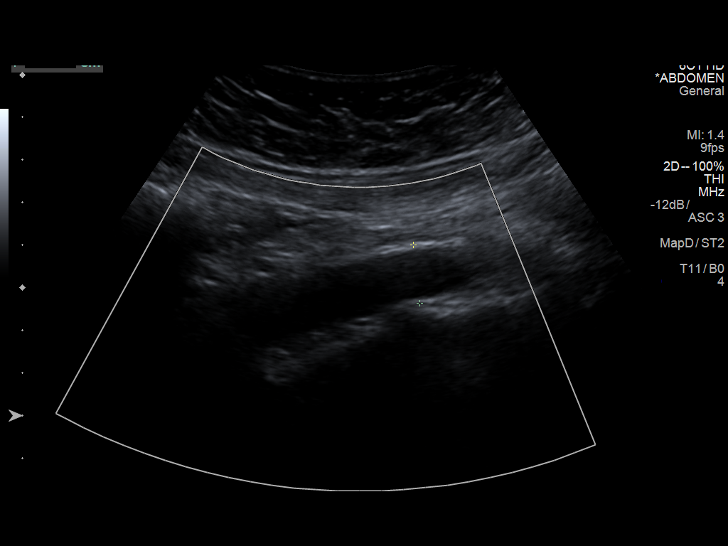
[im 17/102]
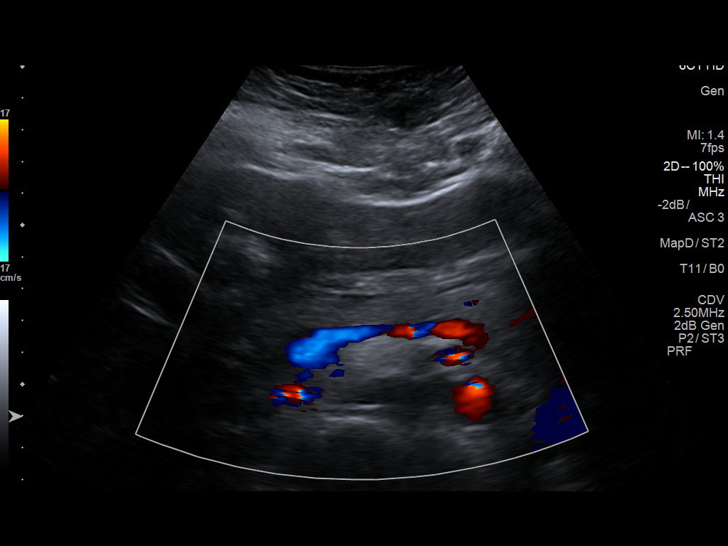
[im 26/102]
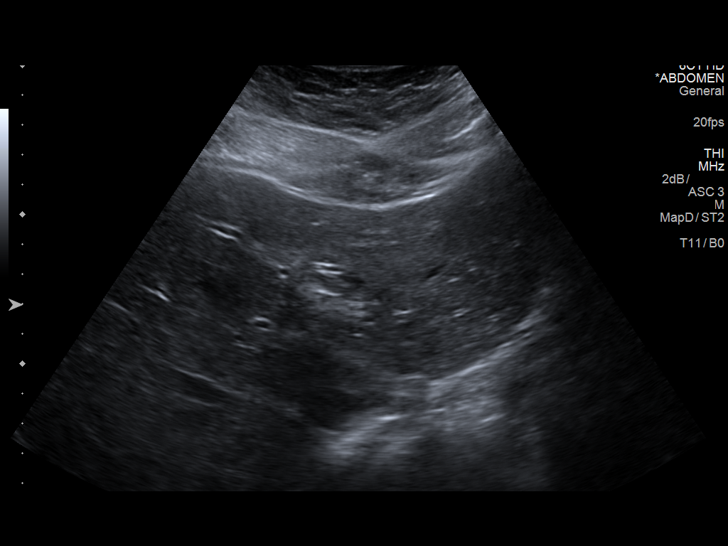
[im 34/102]
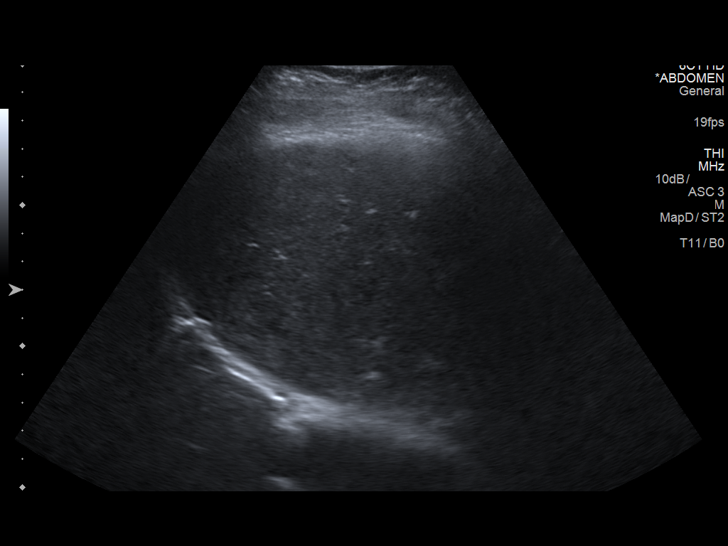
[im 38/102]
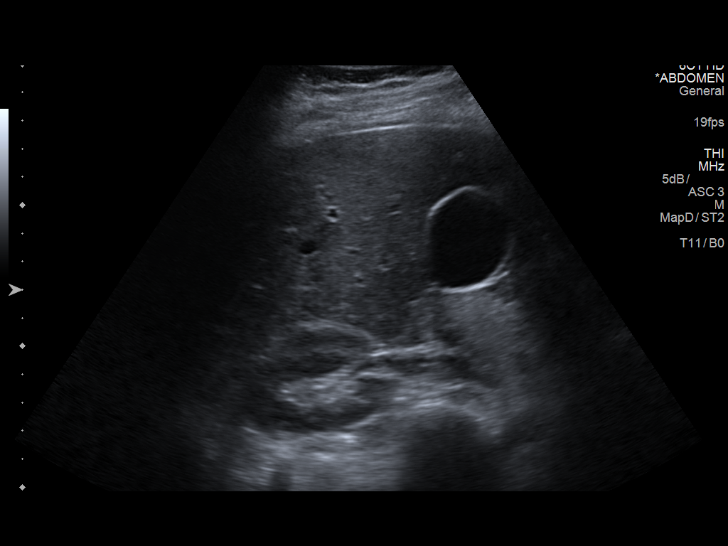
[im 47/102]
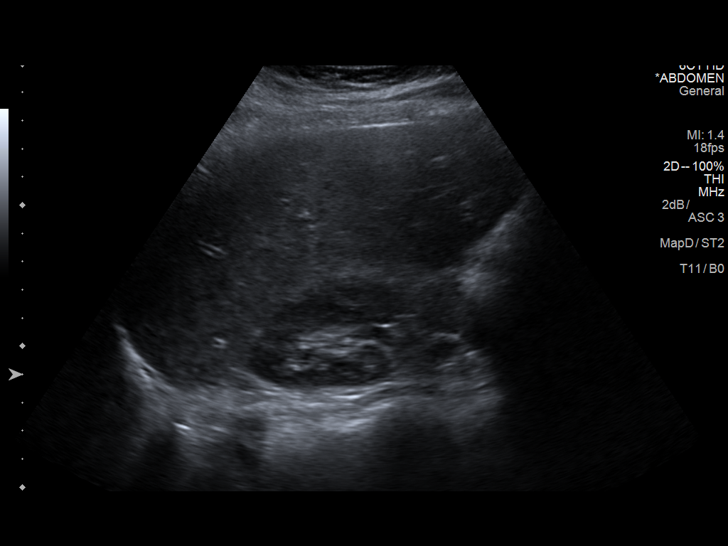
[im 55/102]
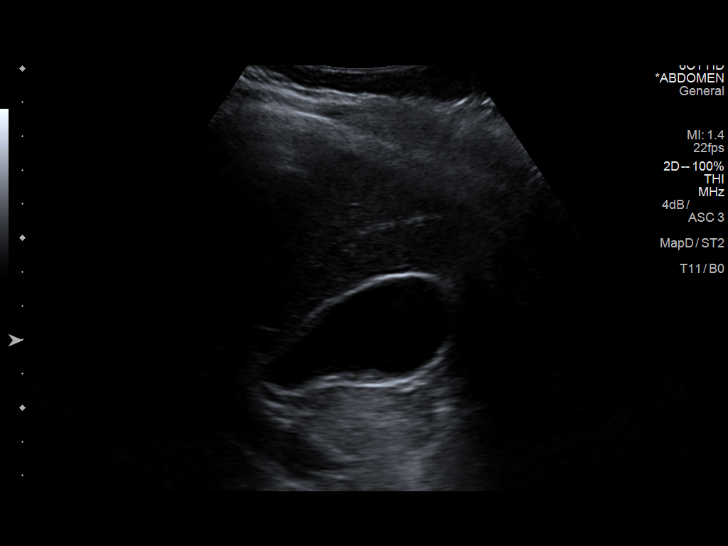
[im 64/102]
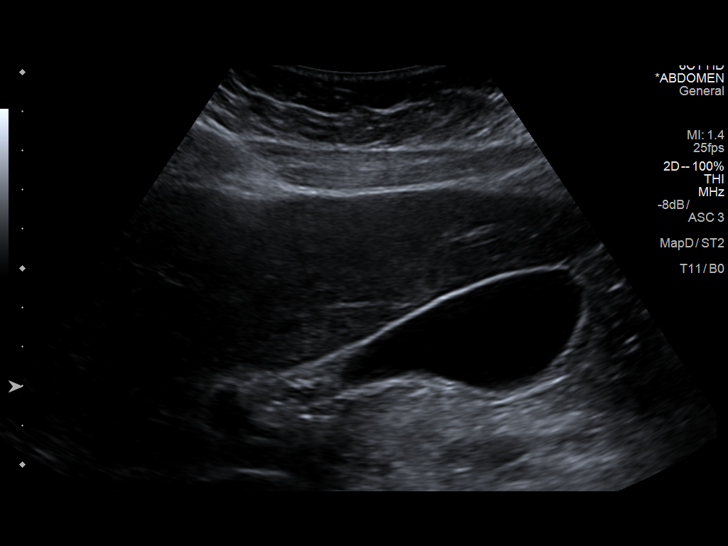
[im 68/102]
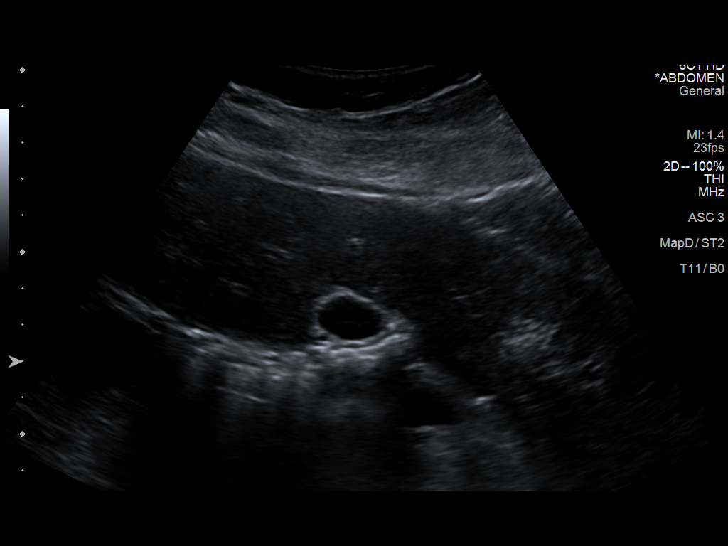
[im 76/102]
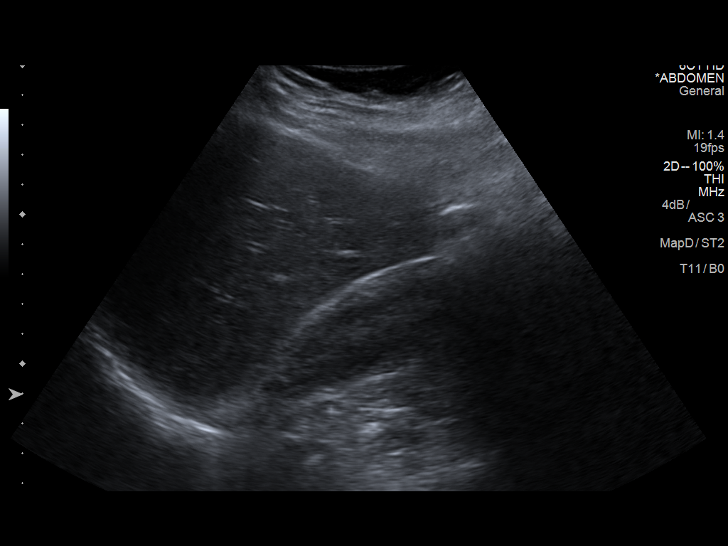
[im 85/102]
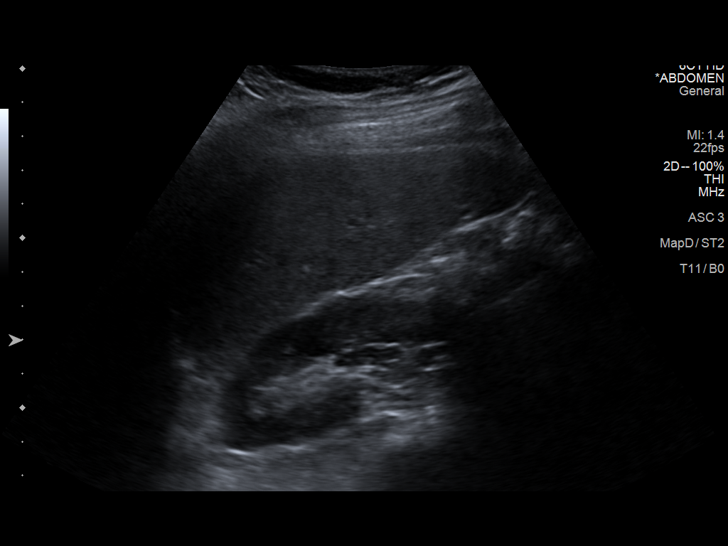
[im 93/102]
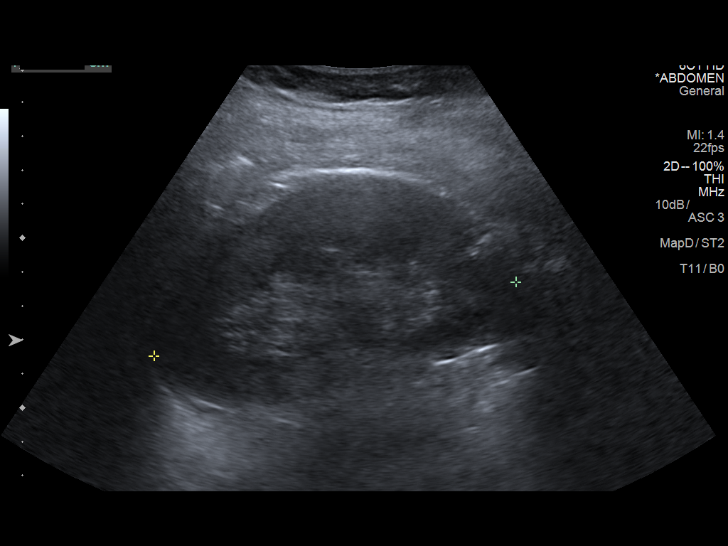
[im 102/102]
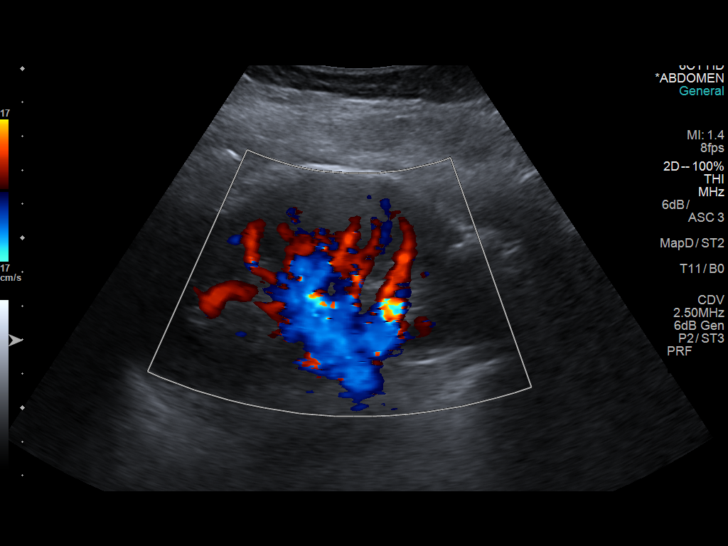

[14 of 25 positions shown; findings below may reference images not displayed]

FINDINGS: Gallbladder: Gallbladder has a normal appearance. Gallbladder wall
is 2.0 millimeters, within normal limits. No stones or
pericholecystic fluid. No sonographic Murphy's sign.

Common bile duct: Diameter: 2.6 millimeters

Liver: No focal lesion identified. Within normal limits in
parenchymal echogenicity. Portal vein is patent on color Doppler
imaging with normal direction of blood flow towards the liver.

IVC: No abnormality visualized.

Pancreas: Visualized portion unremarkable.

Spleen: Size and appearance within normal limits.

Right Kidney: Length: 11.0 centimeter. Echogenicity within normal
limits. No mass or hydronephrosis visualized.

Left Kidney: Length: 10.9 centimeter. Echogenicity within normal
limits. No mass or hydronephrosis visualized.

Abdominal aorta: No aneurysm visualized.

Other findings: None.
IMPRESSION: Normal abdominal ultrasound.
